# Patient Record
Sex: Female | Born: 1990 | Hispanic: Yes | Marital: Single | Smoking: Never smoker
Health system: Southern US, Community
[De-identification: ages and names within clinical notes are randomized; demographics above are authoritative.]

## PROBLEM LIST (undated history)

## (undated) DIAGNOSIS — K08409 Partial loss of teeth, unspecified cause, unspecified class: Secondary | ICD-10-CM

## (undated) DIAGNOSIS — B009 Herpesviral infection, unspecified: Secondary | ICD-10-CM

## (undated) DIAGNOSIS — Z8742 Personal history of other diseases of the female genital tract: Secondary | ICD-10-CM

## (undated) DIAGNOSIS — Z9889 Other specified postprocedural states: Secondary | ICD-10-CM

## (undated) DIAGNOSIS — D649 Anemia, unspecified: Secondary | ICD-10-CM

## (undated) DIAGNOSIS — N83209 Unspecified ovarian cyst, unspecified side: Secondary | ICD-10-CM

## (undated) HISTORY — PX: WISDOM TOOTH EXTRACTION: SHX21

## (undated) HISTORY — DX: Herpesviral infection, unspecified: B00.9

## (undated) SURGERY — Surgical Case
Anesthesia: *Unknown

---

## 2010-12-21 ENCOUNTER — Inpatient Hospital Stay (INDEPENDENT_AMBULATORY_CARE_PROVIDER_SITE_OTHER)
Admission: RE | Admit: 2010-12-21 | Discharge: 2010-12-21 | Disposition: A | Discharge status: Home / Self Care | Payer: 59 | Source: Ambulatory Visit | Attending: Family Medicine | Admitting: Family Medicine

## 2010-12-21 DIAGNOSIS — L089 Local infection of the skin and subcutaneous tissue, unspecified: Secondary | ICD-10-CM

## 2012-07-03 DIAGNOSIS — K08409 Partial loss of teeth, unspecified cause, unspecified class: Secondary | ICD-10-CM

## 2012-07-03 HISTORY — DX: Partial loss of teeth, unspecified cause, unspecified class: K08.409

## 2013-09-10 ENCOUNTER — Inpatient Hospital Stay (HOSPITAL_COMMUNITY)
Admission: AD | Admit: 2013-09-10 | Discharge: 2013-09-11 | Disposition: A | Discharge status: Home / Self Care | Payer: 59 | Source: Ambulatory Visit | Attending: Obstetrics and Gynecology | Admitting: Obstetrics and Gynecology

## 2013-09-10 ENCOUNTER — Inpatient Hospital Stay (HOSPITAL_COMMUNITY): Payer: 59

## 2013-09-10 ENCOUNTER — Encounter (HOSPITAL_COMMUNITY): Payer: Self-pay | Admitting: General Practice

## 2013-09-10 DIAGNOSIS — R1900 Intra-abdominal and pelvic swelling, mass and lump, unspecified site: Secondary | ICD-10-CM | POA: Insufficient documentation

## 2013-09-10 DIAGNOSIS — IMO0002 Reserved for concepts with insufficient information to code with codable children: Secondary | ICD-10-CM

## 2013-09-10 DIAGNOSIS — R109 Unspecified abdominal pain: Secondary | ICD-10-CM | POA: Insufficient documentation

## 2013-09-10 LAB — POCT PREGNANCY, URINE: Preg Test, Ur: NEGATIVE

## 2013-09-10 LAB — WET PREP, GENITAL
Clue Cells Wet Prep HPF POC: NONE SEEN
Trich, Wet Prep: NONE SEEN
Yeast Wet Prep HPF POC: NONE SEEN

## 2013-09-10 LAB — CBC
HEMATOCRIT: 34.1 % — AB (ref 36.0–46.0)
HEMOGLOBIN: 11.6 g/dL — AB (ref 12.0–15.0)
MCH: 27.8 pg (ref 26.0–34.0)
MCHC: 34 g/dL (ref 30.0–36.0)
MCV: 81.6 fL (ref 78.0–100.0)
Platelets: 220 10*3/uL (ref 150–400)
RBC: 4.18 MIL/uL (ref 3.87–5.11)
RDW: 12.6 % (ref 11.5–15.5)
WBC: 7.3 10*3/uL (ref 4.0–10.5)

## 2013-09-10 LAB — URINALYSIS, ROUTINE W REFLEX MICROSCOPIC
Bilirubin Urine: NEGATIVE
GLUCOSE, UA: NEGATIVE mg/dL
Ketones, ur: NEGATIVE mg/dL
Leukocytes, UA: NEGATIVE
Nitrite: NEGATIVE
PROTEIN: NEGATIVE mg/dL
SPECIFIC GRAVITY, URINE: 1.015 (ref 1.005–1.030)
Urobilinogen, UA: 0.2 mg/dL (ref 0.0–1.0)
pH: 7.5 (ref 5.0–8.0)

## 2013-09-10 LAB — URINE MICROSCOPIC-ADD ON

## 2013-09-10 NOTE — MAU Provider Note (Signed)
Chief Complaint: Abdominal Pain  First Provider Initiated Contact with Patient 09/10/13 2311     SUBJECTIVE HPI: Tammy Gutierrez is a 23 y.o. female who presents with feeling like something is protruding from her vagina today. She also reports one month history of worsening low abdominal pain and pressure. She rates her Pain 7/10 on pain scale now. No history of similar pain. She is concerned that she may have uterine prolapse. Episode of increased pressure and pain occurred this evening and was preceded by passing several small clots although it is not time for her menstrual period. LMP 08/24/2013. Has not taken anything for the pain. States pain is aggravated by walking, bending over and position changes. Improves with rest.   States she received a massage of the area one week after the symptoms started and was told "that her uterus was pushed to the back and her ovary was in the wrong place".  History reviewed. No pertinent past surgical history. History   Social History  . Marital Status: Single    Spouse Name: N/A    Number of Children: N/A  . Years of Education: N/A   Occupational History  . Not on file.   Social History Main Topics  . Smoking status: Never Smoker   . Smokeless tobacco: Never Used  . Alcohol Use: No  . Drug Use: No  . Sexual Activity: Not on file   Other Topics Concern  . Not on file   Social History Narrative  . No narrative on file   No current facility-administered medications on file prior to encounter.   No current outpatient prescriptions on file prior to encounter.   No Known Allergies  ROS: Pertinent items in HPI  OBJECTIVE Blood pressure 128/91, pulse 75, temperature 98.9 F (37.2 C), temperature source Oral, resp. rate 18, height 5\' 5"  (1.651 m), weight 72.576 kg (160 lb), last menstrual period 08/24/2013, SpO2 100.00%. GENERAL: Well-developed, well-nourished female in no acute distress. Anxious. HEENT: Normocephalic HEART: normal  rate RESP: normal effort ABDOMEN: Mildly distended, non-tender. Positive bowel sounds. Negative CVA tenderness. EXTREMITIES: Nontender, no edema NEURO: Alert and oriented SPECULUM EXAM: NEFG, physiologic discharge, scant blood noted, cervix clean, nonfriable. No uterine prolapse, cystocele or rectocele noted on pelvic exam with and without Valsalva. BIMANUAL: cervix closed; uterus normal size, no adnexal tenderness or masses. Mild cervical motion tenderness.  LAB RESULTS Results for orders placed during the hospital encounter of 09/10/13 (from the past 24 hour(s))  URINALYSIS, ROUTINE W REFLEX MICROSCOPIC     Status: Abnormal   Collection Time    09/10/13  9:11 PM      Result Value Ref Range   Color, Urine YELLOW  YELLOW   APPearance CLOUDY (*) CLEAR   Specific Gravity, Urine 1.015  1.005 - 1.030   pH 7.5  5.0 - 8.0   Glucose, UA NEGATIVE  NEGATIVE mg/dL   Hgb urine dipstick SMALL (*) NEGATIVE   Bilirubin Urine NEGATIVE  NEGATIVE   Ketones, ur NEGATIVE  NEGATIVE mg/dL   Protein, ur NEGATIVE  NEGATIVE mg/dL   Urobilinogen, UA 0.2  0.0 - 1.0 mg/dL   Nitrite NEGATIVE  NEGATIVE   Leukocytes, UA NEGATIVE  NEGATIVE  URINE MICROSCOPIC-ADD ON     Status: Abnormal   Collection Time    09/10/13  9:11 PM      Result Value Ref Range   Squamous Epithelial / LPF FEW (*) RARE   WBC, UA 0-2  <3 WBC/hpf   RBC / HPF 0-2  <  3 RBC/hpf   Bacteria, UA RARE  RARE   Urine-Other AMORPHOUS URATES/PHOSPHATES    POCT PREGNANCY, URINE     Status: None   Collection Time    09/10/13  9:24 PM      Result Value Ref Range   Preg Test, Ur NEGATIVE  NEGATIVE  WET PREP, GENITAL     Status: Abnormal   Collection Time    09/10/13 11:18 PM      Result Value Ref Range   Yeast Wet Prep HPF POC NONE SEEN  NONE SEEN   Trich, Wet Prep NONE SEEN  NONE SEEN   Clue Cells Wet Prep HPF POC NONE SEEN  NONE SEEN   WBC, Wet Prep HPF POC FEW (*) NONE SEEN  CBC     Status: Abnormal   Collection Time    09/10/13 11:32 PM       Result Value Ref Range   WBC 7.3  4.0 - 10.5 K/uL   RBC 4.18  3.87 - 5.11 MIL/uL   Hemoglobin 11.6 (*) 12.0 - 15.0 g/dL   HCT 78.2 (*) 95.6 - 21.3 %   MCV 81.6  78.0 - 100.0 fL   MCH 27.8  26.0 - 34.0 pg   MCHC 34.0  30.0 - 36.0 g/dL   RDW 08.6  57.8 - 46.9 %   Platelets 220  150 - 400 K/uL    IMAGING US Transvaginal Non-ob  09/11/2013   CLINICAL DATA Pelvic pain  EXAM TRANSABDOMINAL AND TRANSVAGINAL ULTRASOUND OF PELVIS  TECHNIQUE Both transabdominal and transvaginal ultrasound examinations of the pelvis were performed. Transabdominal technique was performed for global imaging of the pelvis including uterus, ovaries, adnexal regions, and pelvic cul-de-sac. It was necessary to proceed with endovaginal exam following the transabdominal exam to visualize the endometrium and adnexa.  COMPARISON None  FINDINGS Uterus  Measurements: 7.4 x 2.7 x 3.6 cm. No fibroids or other mass visualized.  Endometrium  Thickness: 6 mm.  No focal abnormality visualized.  Right ovary  Measurements: 4.6 x 2.8 x 2.2 cm. Normal appearance/no adnexal mass.  Left ovary  Measurements: 3.8 x 2.1 x 2.5 cm. Normal appearance/no adnexal mass.  Other findings  Anechoic cystic mass within the pelvis measuring 9.9 x 7.3 x 9.6 cm.  IMPRESSION 9.9 x 7.3 x 9.6 cm anechoic cystic mass within the pelvis. This may be paraovarian or perhaps exophytic from the right ovary. Recommend nonemergent pelvic MRI follow-up.  SIGNATURE  Electronically Signed   By: Jearld Lesch M.D.   On: 09/11/2013 01:25   US Pelvis Complete  09/11/2013   CLINICAL DATA Pelvic pain  EXAM TRANSABDOMINAL AND TRANSVAGINAL ULTRASOUND OF PELVIS  TECHNIQUE Both transabdominal and transvaginal ultrasound examinations of the pelvis were performed. Transabdominal technique was performed for global imaging of the pelvis including uterus, ovaries, adnexal regions, and pelvic cul-de-sac. It was necessary to proceed with endovaginal exam following the transabdominal exam  to visualize the endometrium and adnexa.  COMPARISON None  FINDINGS Uterus  Measurements: 7.4 x 2.7 x 3.6 cm. No fibroids or other mass visualized.  Endometrium  Thickness: 6 mm.  No focal abnormality visualized.  Right ovary  Measurements: 4.6 x 2.8 x 2.2 cm. Normal appearance/no adnexal mass.  Left ovary  Measurements: 3.8 x 2.1 x 2.5 cm. Normal appearance/no adnexal mass.  Other findings  Anechoic cystic mass within the pelvis measuring 9.9 x 7.3 x 9.6 cm.  IMPRESSION 9.9 x 7.3 x 9.6 cm anechoic cystic mass within the pelvis. This may  be paraovarian or perhaps exophytic from the right ovary. Recommend nonemergent pelvic MRI follow-up.  SIGNATURE  Electronically Signed   By: Jearld Lesch M.D.   On: 09/11/2013 01:25    MAU COURSE CBC, [place], wet prep, gonorrhea and Chlamydia cultures ordered.  Discussed patient's symptoms and ultrasound findings with Dr. Emelda Fear. Will order outpatient MRI as suggested by radiologist to hopefully clarify origin of this cyst.  ASSESSMENT 1. Pelvic cyst    PLAN Discharge home in stable condition per consult with Dr. Emelda Fear. Outpatient MRI orders. Torsion precautions (in case ovarian)     Follow-up Information   Follow up with [facility]. (will call you to schedule an appointment)    Contact information:   [address] [place] [address] [phone]       Follow up with [facility] [place] [facility]. (As needed in emergencies)    Contact information:   [address] [id number] [place] [address] [phone]      Follow up with [facility]. (will call you to schedule MRI)    Specialty:  Radiology   Contact information:   [address] [place] [id number] [place] [address] [phone]       Medication List         ibuprofen 200 MG tablet  Commonly known as:  ADVIL,MOTRIN  Take 400 mg by mouth every 8 (eight) hours as needed for headache or  moderate pain.       [place], CNM 09/11/2013  3:30 AM

## 2013-09-10 NOTE — MAU Note (Signed)
Pt reports she has been having bleeding off/on and pain in lower abd. States she was seen at ER in [place] one month ago and dx with bacterial infection. States she now feels like there is something protruding  From  Her vagina. Some spotting today.

## 2013-09-11 ENCOUNTER — Telehealth: Payer: Self-pay | Admitting: Advanced Practice Midwife

## 2013-09-11 DIAGNOSIS — N9489 Other specified conditions associated with female genital organs and menstrual cycle: Secondary | ICD-10-CM

## 2013-09-11 LAB — GC/CHLAMYDIA PROBE AMP
CT Probe RNA: NEGATIVE
GC PROBE AMP APTIMA: NEGATIVE

## 2013-09-11 NOTE — Discharge Instructions (Signed)

## 2013-09-11 NOTE — Telephone Encounter (Signed)
Patient states she is going to Dr. Tamela Oddi[place] office for follow-up appointment.

## 2013-09-12 NOTE — MAU Provider Note (Signed)
`````  Attestation of Attending Supervision of Advanced Practitioner: Evaluation and management procedures were performed by the PA/NP/CNM/OB Fellow under my supervision/collaboration. Chart reviewed and agree with management and plan.  Sharnelle Cappelli V 09/12/2013 6:39 PM    

## 2013-09-17 NOTE — Progress Notes (Signed)
[facility]s MRI of Pelvis w/without contrast needs pre-auth. Started pre-auth with [facility]-- was not confirmed immediately and stated more clinical information was needed for full review. Called [facility] at [phone] and spoke to nurse who stated to fax recent office visit/notes/imaging to [phone] with ONG#[id number]#[id number] (case number). Was informed that it can take up to 48 hours for case to be reviewed and accepted or declined. Most recent visit to MAU and imaging studies faxed to [phone] with successful fax notice received. Called [facility] to follow [facility]; informed her that it may take up to 48 hours. Lynden AngVicky stated she will continue to check and if not back by Friday morning will re-schedule pt.'s appointment. No further work needed on our end.

## 2013-09-19 ENCOUNTER — Ambulatory Visit
Admission: RE | Admit: 2013-09-19 | Discharge: 2013-09-19 | Disposition: A | Discharge status: Home / Self Care | Payer: 59 | Source: Ambulatory Visit | Attending: Advanced Practice Midwife | Admitting: Advanced Practice Midwife

## 2013-09-19 DIAGNOSIS — IMO0002 Reserved for concepts with insufficient information to code with codable children: Secondary | ICD-10-CM

## 2013-09-19 MED ORDER — GADOBENATE DIMEGLUMINE 529 MG/ML IV SOLN
15.0000 mL | Freq: Once | INTRAVENOUS | Status: AC | PRN
  Administered 2013-09-19: 15 mL via INTRAVENOUS

## 2013-10-02 ENCOUNTER — Ambulatory Visit (INDEPENDENT_AMBULATORY_CARE_PROVIDER_SITE_OTHER): Payer: 59 | Admitting: Obstetrics & Gynecology

## 2013-10-02 ENCOUNTER — Encounter: Payer: Self-pay | Admitting: Obstetrics & Gynecology

## 2013-10-02 ENCOUNTER — Ambulatory Visit: Payer: Managed Care, Other (non HMO) | Admitting: Obstetrics & Gynecology

## 2013-10-02 VITALS — BP 109/71 | HR 66 | Ht 65.0 in | Wt 165.0 lb

## 2013-10-02 DIAGNOSIS — N83209 Unspecified ovarian cyst, unspecified side: Secondary | ICD-10-CM

## 2013-10-02 DIAGNOSIS — R1900 Intra-abdominal and pelvic swelling, mass and lump, unspecified site: Secondary | ICD-10-CM

## 2013-10-02 LAB — POCT URINE PREGNANCY: Preg Test, Ur: NEGATIVE

## 2013-10-02 NOTE — Progress Notes (Signed)
Subjective:     Tammy Gutierrez is a 23 y.o. female here for a routine exam.  Current complaints: Patient is in the office today for a problem visit. Patient states [facility] and was told she had a cyst. Patient states they told her to follow up here about surgery. Patient states she took Plan B twice and would like to know if that would have caused the enlargement of the cyst. Patient states last year she was having bleeding every time she had intercourse, Patient states she hasn't had any bleeding since February. Patient states she was also having discharge but not anymore. Patient states it hurts to walk or lift things.   The HPI was reviewed and explored in further detail by the provider. Gynecologic History Patient's last menstrual period was 09/30/2013. Contraception: abstinence Last Pap: 2014. Results were: normal  Obstetric History OB History  Gravida Para Term Preterm AB SAB TAB Ectopic Multiple Living  0 0 0 0 0 0 0 0 0 0          The following portions of the patient's history were reviewed and updated as appropriate: allergies, current medications, past family history, past medical history, past social history, past surgical history and problem list.  Review of Systems Pertinent items are noted in HPI.    Objective:     Abd: NT Pelvic: cystic mass anterior/superior to bladder--NT     75% of 20 min visit spent on counseling and coordination of care.   Assessment:    Large, adnexal cyst--symptomatic--? Intermittent torsion  Plan:   A laparoscopic ovarian cystectomy is planned The risks/benefits of surgery were reviewed Return postop

## 2013-10-03 DIAGNOSIS — R1900 Intra-abdominal and pelvic swelling, mass and lump, unspecified site: Secondary | ICD-10-CM | POA: Insufficient documentation

## 2013-10-03 NOTE — Patient Instructions (Signed)
Ovarian Cystectomy Ovarian cystectomy is surgery to remove a fluid-filled sac (cyst) on an ovary. The ovaries are small organs that produce eggs in women. Various types of cysts can form on the ovaries. Most are not cancerous. Surgery may be done if a cyst is large or is causing symptoms such as pain. It may also be done for a cyst that is or might be cancerous. This surgery can be done using a laparoscopic technique or an open abdominal technique. The laparoscopic technique involves smaller cuts (incisions) and a faster recovery time. The technique used will depend on your age, the type of cyst, and whether the cyst is cancerous. The laparoscopic technique is not used for a cancerous cyst. LET YOUR HEALTH CARE PROVIDER KNOW ABOUT:   Any allergies you have.  All medicines you are taking, including vitamins, herbs, eye drops, creams, and over-the-counter medicines.  Previous problems you or members of your family have had with the use of anesthetics.  Any blood disorders you have.  Previous surgeries you have had.  Medical conditions you have.  Any chance you might be pregnant. RISKS AND COMPLICATIONS Generally, this is a safe procedure. However, as with any procedure, complications can occur. Possible complications include:  Excessive bleeding.  Infection.  Injury to other organs.  Blood clots.  Becoming incapable of getting pregnant (infertile). BEFORE THE PROCEDURE  Ask your health care provider about changing or stopping any regular medicines. Avoid taking aspirin, ibuprofen, or blood thinners as directed by your health care provider.  Do not eat or drink anything after midnight the night before surgery.  If you smoke, do not smoke for at least 2 weeks before your surgery.  Do not drink alcohol the day before your surgery.  Let your health care provider know if you develop a cold or any infection before your surgery.  Arrange for someone to drive you home after the  procedure or after your hospital stay. Also arrange for someone to help you with activities during recovery. PROCEDURE  Either a laparoscopic technique or an open abdominal technique may be used for this surgery.  Small monitors will be put on your body. They are used to check your heart, blood pressure, and oxygen level.   An IV access tube will be put into one of your veins. Medicine will be able to flow directly into your body through this IV tube.   You might be given a medicine to help you relax (sedative).   You will be given a medicine to make you sleep (general anesthetic). A breathing tube may be placed into your lungs during the procedure. Laparoscopic Technique  Several small cuts (incisions) are made in your abdomen. These are typically about 1 to 2 cm long.   Your abdomen will be filled with carbon dioxide gas so that it expands. This gives the surgeon more room to operate and makes your organs easier to see.   A thin, lighted tube with a tiny camera on the end (laparoscope) is put through one of the small incisions. The camera on the laparoscope sends a picture to a TV screen in the operating room. This gives the surgeon a good view inside your abdomen.   Hollow tubes are put through the other small incisions in your abdomen. The tools needed for the procedure are put through these tubes.  The ovary with the cyst is identified, and the cyst is removed. It is sent to the lab for testing. If it is cancer, both ovaries   may need to be removed during a different surgery.  Tools are removed. The incisions are then closed with stitches or skin glue, and dressings may be applied. Open Abdominal Technique  A single large incision is made along your bikini line or in the middle of your lower abdomen.  The ovary with the cyst is identified, and the cyst is removed. It is sent to the lab for testing. If it is cancer, both ovaries may need to be removed during a different  surgery.  The incision is then closed with stitches or staples. AFTER THE PROCEDURE   You will wake up from anesthesia and be taken to a recovery area.  If you had laparoscopic surgery, you may be able to go home the same day, or you may need to stay in the hospital overnight.  If you had open abdominal surgery, you will need to stay in the hospital for a few days.  Your IV access tube and catheter will be removed the first or second day, after you are able to eat and drink enough.  You may be given medicine to relieve pain or to help you sleep.  You may be given an antibiotic medicine if needed. Document Released: 04/16/2007 Document Revised: 04/09/2013 Document Reviewed: 01/29/2013 [facility] Patient Information 2014 [place], [place]. Ovarian Cyst An ovarian cyst is a fluid-filled sac that forms on an ovary. The ovaries are small organs that produce eggs in women. Various types of cysts can form on the ovaries. Most are not cancerous. Many do not cause problems, and they often go away on their own. Some may cause symptoms and require treatment. Common types of ovarian cysts include:  Functional cysts These cysts may occur every month during the menstrual cycle. This is normal. The cysts usually go away with the next menstrual cycle if the woman does not get pregnant. Usually, there are no symptoms with a functional cyst.  Endometrioma cysts These cysts form from the tissue that lines the uterus. They are also called "chocolate cysts" because they become filled with blood that turns brown. This type of cyst can cause pain in the lower abdomen during intercourse and with your menstrual period.  Cystadenoma cysts This type develops from the cells on the outside of the ovary. These cysts can get very big and cause lower abdomen pain and pain with intercourse. This type of cyst can twist on itself, cut off its blood supply, and cause severe pain. It can also easily rupture and cause a lot of  pain.  Dermoid cysts This type of cyst is sometimes found in both ovaries. These cysts may contain different kinds of body tissue, such as skin, teeth, hair, or cartilage. They usually do not cause symptoms unless they get very big.  Theca lutein cysts These cysts occur when too much of a certain hormone (human chorionic gonadotropin) is produced and overstimulates the ovaries to produce an egg. This is most common after procedures used to assist with the conception of a baby (in vitro fertilization). CAUSES   Fertility drugs can cause a condition in which multiple large cysts are formed on the ovaries. This is called ovarian hyperstimulation syndrome.  A condition called polycystic ovary syndrome can cause hormonal imbalances that can lead to nonfunctional ovarian cysts. SIGNS AND SYMPTOMS  Many ovarian cysts do not cause symptoms. If symptoms are present, they may include:  Pelvic pain or pressure.  Pain in the lower abdomen.  Pain during sexual intercourse.  Increasing girth (swelling) of  the abdomen.  Abnormal menstrual periods.  Increasing pain with menstrual periods.  Stopping having menstrual periods without being pregnant. DIAGNOSIS  These cysts are commonly found during a routine or annual pelvic exam. Tests may be ordered to find out more about the cyst. These tests may include:  Ultrasound.  X-ray of the pelvis.  CT scan.  MRI.  Blood tests. TREATMENT  Many ovarian cysts go away on their own without treatment. Your health care provider may want to check your cyst regularly for 2 3 months to see if it changes. For women in menopause, it is particularly important to monitor a cyst closely because of the higher rate of ovarian cancer in menopausal women. When treatment is needed, it may include any of the following:  A procedure to drain the cyst (aspiration). This may be done using a long needle and ultrasound. It can also be done through a laparoscopic procedure.  This involves using a thin, lighted tube with a tiny camera on the end (laparoscope) inserted through a small incision.  Surgery to remove the whole cyst. This may be done using laparoscopic surgery or an open surgery involving a larger incision in the lower abdomen.  Hormone treatment or birth control pills. These methods are sometimes used to help dissolve a cyst. HOME CARE INSTRUCTIONS   Only take over-the-counter or prescription medicines as directed by your health care provider.  Follow up with your health care provider as directed.  Get regular pelvic exams and Pap tests. SEEK MEDICAL CARE IF:   Your periods are late, irregular, or painful, or they stop.  Your pelvic pain or abdominal pain does not go away.  Your abdomen becomes larger or swollen.  You have pressure on your bladder or trouble emptying your bladder completely.  You have pain during sexual intercourse.  You have feelings of fullness, pressure, or discomfort in your stomach.  You lose weight for no apparent reason.  You feel generally ill.  You become constipated.  You lose your appetite.  You develop acne.  You have an increase in body and facial hair.  You are gaining weight, without changing your exercise and eating habits.  You think you are pregnant. SEEK IMMEDIATE MEDICAL CARE IF:   You have increasing abdominal pain.  You feel sick to your stomach (nauseous), and you throw up (vomit).  You develop a fever that comes on suddenly.  You have abdominal pain during a bowel movement.  Your menstrual periods become heavier than usual. Document Released: 06/19/2005 Document Revised: 04/09/2013 Document Reviewed: 02/24/2013 [facility] Patient Information 2014 [place], [place].

## 2013-10-06 ENCOUNTER — Other Ambulatory Visit: Payer: Self-pay | Admitting: *Deleted

## 2013-10-08 ENCOUNTER — Encounter: Payer: Self-pay | Admitting: Obstetrics & Gynecology

## 2013-10-08 ENCOUNTER — Other Ambulatory Visit: Payer: Self-pay | Admitting: *Deleted

## 2013-10-09 ENCOUNTER — Encounter (HOSPITAL_COMMUNITY): Payer: Self-pay | Admitting: Pharmacist

## 2013-10-13 ENCOUNTER — Encounter (HOSPITAL_COMMUNITY)
Admission: RE | Admit: 2013-10-13 | Discharge: 2013-10-13 | Disposition: A | Discharge status: Home / Self Care | Payer: 59 | Source: Ambulatory Visit | Attending: Obstetrics & Gynecology | Admitting: Obstetrics & Gynecology

## 2013-10-13 ENCOUNTER — Encounter (HOSPITAL_COMMUNITY): Payer: Self-pay

## 2013-10-13 ENCOUNTER — Encounter: Payer: Self-pay | Admitting: Obstetrics & Gynecology

## 2013-10-13 ENCOUNTER — Ambulatory Visit: Payer: 59 | Admitting: Obstetrics & Gynecology

## 2013-10-13 VITALS — BP 116/74 | HR 68 | Temp 97.8°F | Ht 65.0 in | Wt 164.0 lb

## 2013-10-13 DIAGNOSIS — R109 Unspecified abdominal pain: Secondary | ICD-10-CM

## 2013-10-13 DIAGNOSIS — R1900 Intra-abdominal and pelvic swelling, mass and lump, unspecified site: Secondary | ICD-10-CM

## 2013-10-13 DIAGNOSIS — Z01812 Encounter for preprocedural laboratory examination: Secondary | ICD-10-CM | POA: Insufficient documentation

## 2013-10-13 LAB — CBC
HEMATOCRIT: 34.2 % — AB (ref 36.0–46.0)
HEMOGLOBIN: 11.5 g/dL — AB (ref 12.0–15.0)
MCH: 28 pg (ref 26.0–34.0)
MCHC: 33.6 g/dL (ref 30.0–36.0)
MCV: 83.4 fL (ref 78.0–100.0)
PLATELETS: 256 10*3/uL (ref 150–400)
RBC: 4.1 MIL/uL (ref 3.87–5.11)
RDW: 12.7 % (ref 11.5–15.5)
WBC: 6.8 10*3/uL (ref 4.0–10.5)

## 2013-10-13 LAB — POCT URINALYSIS DIPSTICK
BILIRUBIN UA: NEGATIVE
GLUCOSE UA: NEGATIVE
KETONES UA: NEGATIVE
Leukocytes, UA: NEGATIVE
Nitrite, UA: NEGATIVE
PH UA: 5
Protein, UA: NEGATIVE
RBC UA: NEGATIVE
SPEC GRAV UA: 1.02
Urobilinogen, UA: NEGATIVE

## 2013-10-13 MED ORDER — TRAMADOL HCL 50 MG PO TABS: 50.0000 mg | ORAL_TABLET | Freq: Four times a day (QID) | ORAL | Status: DC | PRN

## 2013-10-13 NOTE — Patient Instructions (Addendum)
   Your procedure is scheduled on:  Friday, April 17  [facility] at[facility]  0845 AM Pick up the phone at the desk and dial [phone] and inform us of your arrival.  Please call this number if you have any problems the morning of surgery: [phone]  Remember: Do not eat or drink after midnight: Thursday Take these medicines the morning of surgery with a SIP OF WATER:  None  Do not wear jewelry, make-up, or FINGER nail polish No metal in your hair or on your body. Do not wear lotions, powders, perfumes.  You may wear deodorant.  Do not bring valuables to the hospital. Contacts, dentures or bridgework may not be worn into surgery.  Patients discharged on the day of surgery will not be allowed to drive home.  Home with boyfriend Lavera Guiseliel Tapia cell [phone].

## 2013-10-13 NOTE — Progress Notes (Signed)
  Subjective:     Tammy Gutierrez is a 23 y.o. female who presents for evaluation of abdominal pain. Onset was 5 days ago. Symptoms have been gradually worsening. The pain is described as cramping and worse with eating, and is 9/10 in intensity. Pain is located in the RLQ without radiation.  Aggravating factors: eating.  Alleviating factors: liquids. Associated symptoms: none. The patient denies constipation, diarrhea, flatus, nausea and vomiting. Known diagnosis of a 10 cm adnexal cyst--for surgery this week.  The patient's history has been marked as reviewed and updated as appropriate. The patient&#39;s history has been marked as reviewed and updated as appropriate.  Review of Systems Pertinent items are noted in HPI.    Objective:      General:   alert  Skin:   no rash or abnormalities  Lungs:   clear to auscultation bilaterally  Heart:   regular rate and rhythm, S1, S2 normal, no murmur, click, rub or gallop  Breasts:   normal without suspicious masses, skin or nipple changes or axillary nodes  Abdomen:  normal findings: no organomegaly, soft, non-tender and no hernia  Pelvis:  External genitalia: normal general appearance Urinary system: urethral meatus normal and bladder without fullness, nontender Vaginal: normal without tenderness, induration or masses Cervix: normal appearance Adnexa: normal bimanual exam Uterus: anteverted and non-tender, normal size    Assessment:    Abdominal pain,secondary to a large adnexal cyst .   No signs/symptoms of an acute abdomen Plan:   Check CBC Warning signs reviewed Surgery this week

## 2013-10-14 ENCOUNTER — Encounter: Payer: Self-pay | Admitting: *Deleted

## 2013-10-14 LAB — CBC WITH DIFFERENTIAL/PLATELET
BASOS PCT: 0 % (ref 0–1)
Basophils Absolute: 0 10*3/uL (ref 0.0–0.1)
EOS ABS: 0.1 10*3/uL (ref 0.0–0.7)
Eosinophils Relative: 1 % (ref 0–5)
HEMATOCRIT: 35.7 % — AB (ref 36.0–46.0)
HEMOGLOBIN: 11.7 g/dL — AB (ref 12.0–15.0)
Lymphocytes Relative: 36 % (ref 12–46)
Lymphs Abs: 2.7 10*3/uL (ref 0.7–4.0)
MCH: 27.1 pg (ref 26.0–34.0)
MCHC: 32.8 g/dL (ref 30.0–36.0)
MCV: 82.8 fL (ref 78.0–100.0)
MONO ABS: 0.5 10*3/uL (ref 0.1–1.0)
MONOS PCT: 6 % (ref 3–12)
Neutro Abs: 4.3 10*3/uL (ref 1.7–7.7)
Neutrophils Relative %: 57 % (ref 43–77)
Platelets: 295 10*3/uL (ref 150–400)
RBC: 4.31 MIL/uL (ref 3.87–5.11)
RDW: 13.7 % (ref 11.5–15.5)
WBC: 7.5 10*3/uL (ref 4.0–10.5)

## 2013-10-16 NOTE — H&P (Signed)
  Chief Complaint: 23 y.o.  who presents with a pelvic mass  Details of Present Illness: The patient presented with pelvic pain that has been gradually worsening.  Radiologic imaging shows a 10 cm, simple, cystic pelvic mass--possibly paraovarian.  Ht 5\' 5"  (1.651 m)  Wt 72.576 kg (160 lb)  BMI 26.63 kg/m2  No past medical history on file. History   Social History  . Marital Status: Single    Spouse Name: N/A    Number of Children: N/A  . Years of Education: N/A   Occupational History  . Not on file.   Social History Main Topics  . Smoking status: Never Smoker   . Smokeless tobacco: Never Used  . Alcohol Use: No  . Drug Use: No  . Sexual Activity: Not Currently    Birth Control/ Protection: None   Other Topics Concern  . Not on file   Social History Narrative  . No narrative on file   Family History  Problem Relation Age of Onset  . Depression Mother   . Cancer Neg Hx   . Heart disease Neg Hx   . Diabetes Neg Hx   . Hypertension Neg Hx     Pertinent items are noted in HPI.  Pre-Op Diagnosis: Pelvic mass [id number]   Planned Procedure: Procedure(s): ROBOTIC ASSISTED LAPAROSCOPIC OVARIAN CYSTECTOMY  I have reviewed the patient's history and have completed the physical exam and Cambryn Luis-Arriaga is acceptable for surgery.  Antionette CharLisa Jackson-Moore, MD 10/16/2013 7:01 PM

## 2013-10-17 ENCOUNTER — Ambulatory Visit (HOSPITAL_COMMUNITY): Payer: 59 | Admitting: Anesthesiology

## 2013-10-17 ENCOUNTER — Encounter (HOSPITAL_COMMUNITY): Payer: 59 | Admitting: Anesthesiology

## 2013-10-17 ENCOUNTER — Other Ambulatory Visit: Payer: Self-pay | Admitting: Obstetrics

## 2013-10-17 ENCOUNTER — Encounter (HOSPITAL_COMMUNITY): Payer: Self-pay | Admitting: *Deleted

## 2013-10-17 ENCOUNTER — Encounter (HOSPITAL_COMMUNITY)
Admission: RE | Disposition: A | Discharge status: Home / Self Care | Payer: Self-pay | Source: Ambulatory Visit | Attending: Obstetrics & Gynecology

## 2013-10-17 ENCOUNTER — Ambulatory Visit (HOSPITAL_COMMUNITY)
Admission: RE | Admit: 2013-10-17 | Discharge: 2013-10-17 | Disposition: A | Discharge status: Home / Self Care | Payer: 59 | Source: Ambulatory Visit | Attending: Obstetrics & Gynecology | Admitting: Obstetrics & Gynecology

## 2013-10-17 DIAGNOSIS — N949 Unspecified condition associated with female genital organs and menstrual cycle: Secondary | ICD-10-CM | POA: Insufficient documentation

## 2013-10-17 DIAGNOSIS — Z8742 Personal history of other diseases of the female genital tract: Secondary | ICD-10-CM

## 2013-10-17 DIAGNOSIS — R1900 Intra-abdominal and pelvic swelling, mass and lump, unspecified site: Secondary | ICD-10-CM

## 2013-10-17 DIAGNOSIS — N838 Other noninflammatory disorders of ovary, fallopian tube and broad ligament: Secondary | ICD-10-CM | POA: Insufficient documentation

## 2013-10-17 DIAGNOSIS — R52 Pain, unspecified: Secondary | ICD-10-CM

## 2013-10-17 HISTORY — DX: Personal history of other diseases of the female genital tract: Z87.42

## 2013-10-17 HISTORY — PX: ROBOTIC ASSISTED LAPAROSCOPIC OVARIAN CYSTECTOMY: SHX6081

## 2013-10-17 LAB — PREGNANCY, URINE: Preg Test, Ur: NEGATIVE

## 2013-10-17 SURGERY — ROBOTIC ASSISTED LAPAROSCOPIC OVARIAN CYSTECTOMY
Anesthesia: General | Site: Abdomen

## 2013-10-17 SURGERY — Surgical Case
Anesthesia: *Unknown

## 2013-10-17 MED ORDER — MIDAZOLAM HCL 2 MG/2ML IJ SOLN
INTRAMUSCULAR | Status: AC
  Filled 2013-10-17: qty 2

## 2013-10-17 MED ORDER — KETOROLAC TROMETHAMINE 30 MG/ML IJ SOLN: 15.0000 mg | Freq: Once | INTRAMUSCULAR | Status: DC | PRN

## 2013-10-17 MED ORDER — OXYCODONE HCL 5 MG PO TABS: 5.0000 mg | ORAL_TABLET | ORAL | Status: DC | PRN

## 2013-10-17 MED ORDER — ARTIFICIAL TEARS OP OINT
TOPICAL_OINTMENT | OPHTHALMIC | Status: DC | PRN
  Administered 2013-10-17: 1 via OPHTHALMIC

## 2013-10-17 MED ORDER — LACTATED RINGERS IV SOLN
INTRAVENOUS | Status: DC
  Administered 2013-10-17 (×2): via INTRAVENOUS

## 2013-10-17 MED ORDER — PROPOFOL 10 MG/ML IV BOLUS
INTRAVENOUS | Status: DC | PRN
  Administered 2013-10-17: 150 mg via INTRAVENOUS

## 2013-10-17 MED ORDER — BUPIVACAINE HCL (PF) 0.25 % IJ SOLN
INTRAMUSCULAR | Status: DC | PRN
  Administered 2013-10-17: 9 mL

## 2013-10-17 MED ORDER — NEOSTIGMINE METHYLSULFATE 1 MG/ML IJ SOLN
INTRAMUSCULAR | Status: DC | PRN
  Administered 2013-10-17: 3 mg via INTRAVENOUS

## 2013-10-17 MED ORDER — KETOROLAC TROMETHAMINE 30 MG/ML IJ SOLN
30.0000 mg | Freq: Four times a day (QID) | INTRAMUSCULAR | Status: DC
Start: 2013-10-17 — End: 2013-10-17
  Administered 2013-10-17: 30 mg via INTRAVENOUS

## 2013-10-17 MED ORDER — LIDOCAINE HCL (CARDIAC) 20 MG/ML IV SOLN
INTRAVENOUS | Status: DC | PRN
  Administered 2013-10-17: 80 mg via INTRAVENOUS

## 2013-10-17 MED ORDER — MEPERIDINE HCL 25 MG/ML IJ SOLN: 6.2500 mg | INTRAMUSCULAR | Status: DC | PRN

## 2013-10-17 MED ORDER — KETOROLAC TROMETHAMINE 30 MG/ML IJ SOLN
INTRAMUSCULAR | Status: AC
  Administered 2013-10-17: 30 mg via INTRAVENOUS
  Filled 2013-10-17: qty 1

## 2013-10-17 MED ORDER — FENTANYL CITRATE 0.05 MG/ML IJ SOLN
INTRAMUSCULAR | Status: AC
  Filled 2013-10-17: qty 5

## 2013-10-17 MED ORDER — DEXAMETHASONE SODIUM PHOSPHATE 10 MG/ML IJ SOLN
INTRAMUSCULAR | Status: AC
  Filled 2013-10-17: qty 1

## 2013-10-17 MED ORDER — TRAMADOL HCL 50 MG PO TABS: 50.0000 mg | ORAL_TABLET | Freq: Four times a day (QID) | ORAL | Status: DC | PRN

## 2013-10-17 MED ORDER — HEPARIN SODIUM (PORCINE) 5000 UNIT/ML IJ SOLN
INTRAMUSCULAR | Status: AC
  Filled 2013-10-17: qty 1

## 2013-10-17 MED ORDER — PROPOFOL 10 MG/ML IV EMUL
INTRAVENOUS | Status: AC
  Filled 2013-10-17: qty 20

## 2013-10-17 MED ORDER — SODIUM CHLORIDE 0.9 % IV SOLN: 250.0000 mL | INTRAVENOUS | Status: DC | PRN

## 2013-10-17 MED ORDER — ROCURONIUM BROMIDE 100 MG/10ML IV SOLN
INTRAVENOUS | Status: DC | PRN
  Administered 2013-10-17: 5 mg via INTRAVENOUS
  Administered 2013-10-17: 25 mg via INTRAVENOUS
  Administered 2013-10-17: 10 mg via INTRAVENOUS

## 2013-10-17 MED ORDER — BUPIVACAINE HCL (PF) 0.25 % IJ SOLN
INTRAMUSCULAR | Status: AC
  Filled 2013-10-17: qty 30

## 2013-10-17 MED ORDER — ONDANSETRON HCL 4 MG/2ML IJ SOLN
INTRAMUSCULAR | Status: AC
  Filled 2013-10-17: qty 2

## 2013-10-17 MED ORDER — MIDAZOLAM HCL 2 MG/2ML IJ SOLN
INTRAMUSCULAR | Status: DC | PRN
  Administered 2013-10-17: 2 mg via INTRAVENOUS

## 2013-10-17 MED ORDER — IBUPROFEN 800 MG PO TABS: 800.0000 mg | ORAL_TABLET | Freq: Three times a day (TID) | ORAL | Status: DC | PRN

## 2013-10-17 MED ORDER — LIDOCAINE HCL (CARDIAC) 20 MG/ML IV SOLN
INTRAVENOUS | Status: AC
  Filled 2013-10-17: qty 5

## 2013-10-17 MED ORDER — GLYCOPYRROLATE 0.2 MG/ML IJ SOLN
INTRAMUSCULAR | Status: AC
  Filled 2013-10-17: qty 3

## 2013-10-17 MED ORDER — SODIUM CHLORIDE 0.9 % IJ SOLN: 3.0000 mL | Freq: Two times a day (BID) | INTRAMUSCULAR | Status: DC

## 2013-10-17 MED ORDER — ACETAMINOPHEN 325 MG PO TABS: 650.0000 mg | ORAL_TABLET | ORAL | Status: DC | PRN

## 2013-10-17 MED ORDER — FENTANYL CITRATE 0.05 MG/ML IJ SOLN
INTRAMUSCULAR | Status: DC | PRN
  Administered 2013-10-17: 50 ug via INTRAVENOUS
  Administered 2013-10-17: 100 ug via INTRAVENOUS
  Administered 2013-10-17 (×2): 50 ug via INTRAVENOUS

## 2013-10-17 MED ORDER — HYDROMORPHONE HCL PF 1 MG/ML IJ SOLN
INTRAMUSCULAR | Status: AC
  Administered 2013-10-17: 0.25 mg via INTRAVENOUS
  Filled 2013-10-17: qty 1

## 2013-10-17 MED ORDER — SODIUM CHLORIDE 0.9 % IJ SOLN: 3.0000 mL | INTRAMUSCULAR | Status: DC | PRN

## 2013-10-17 MED ORDER — HYDROMORPHONE HCL PF 1 MG/ML IJ SOLN
0.2500 mg | INTRAMUSCULAR | Status: DC | PRN
  Administered 2013-10-17: 0.25 mg via INTRAVENOUS

## 2013-10-17 MED ORDER — ONDANSETRON HCL 4 MG/2ML IJ SOLN
INTRAMUSCULAR | Status: DC | PRN
  Administered 2013-10-17: 4 mg via INTRAVENOUS

## 2013-10-17 MED ORDER — GLYCOPYRROLATE 0.2 MG/ML IJ SOLN
INTRAMUSCULAR | Status: DC | PRN
  Administered 2013-10-17: 0.1 mg via INTRAVENOUS
  Administered 2013-10-17: .5 mg via INTRAVENOUS

## 2013-10-17 MED ORDER — ACETAMINOPHEN 650 MG RE SUPP
650.0000 mg | RECTAL | Status: DC | PRN
  Filled 2013-10-17: qty 1

## 2013-10-17 MED ORDER — FENTANYL CITRATE 0.05 MG/ML IJ SOLN: 25.0000 ug | INTRAMUSCULAR | Status: DC | PRN

## 2013-10-17 MED ORDER — NEOSTIGMINE METHYLSULFATE 1 MG/ML IJ SOLN
INTRAMUSCULAR | Status: AC
  Filled 2013-10-17: qty 1

## 2013-10-17 MED ORDER — PROMETHAZINE HCL 25 MG/ML IJ SOLN: 6.2500 mg | INTRAMUSCULAR | Status: DC | PRN

## 2013-10-17 MED ORDER — MIDAZOLAM HCL 2 MG/2ML IJ SOLN: 0.5000 mg | Freq: Once | INTRAMUSCULAR | Status: DC | PRN

## 2013-10-17 MED ORDER — DEXAMETHASONE SODIUM PHOSPHATE 10 MG/ML IJ SOLN
INTRAMUSCULAR | Status: DC | PRN
  Administered 2013-10-17: 10 mg via INTRAVENOUS

## 2013-10-17 SURGICAL SUPPLY — 62 items
BARRIER ADHS 3X4 INTERCEED (GAUZE/BANDAGES/DRESSINGS) IMPLANT
BENZOIN TINCTURE PRP APPL 2/3 (GAUZE/BANDAGES/DRESSINGS) ×3 IMPLANT
CABLE HIGH FREQUENCY MONO STRZ (ELECTRODE) ×3 IMPLANT
CLOSURE WOUND 1/4X4 (GAUZE/BANDAGES/DRESSINGS) ×1
CLOTH BEACON ORANGE TIMEOUT ST (SAFETY) ×3 IMPLANT
CONT PATH 16OZ SNAP LID 3702 (MISCELLANEOUS) ×3 IMPLANT
CORD BIPOLAR FORCEPS 12FT (ELECTRODE) ×3 IMPLANT
COVER MAYO STAND STRL (DRAPES) ×3 IMPLANT
COVER TABLE BACK 60X90 (DRAPES) ×6 IMPLANT
COVER TIP SHEARS 8 DVNC (MISCELLANEOUS) ×1 IMPLANT
COVER TIP SHEARS 8MM DA VINCI (MISCELLANEOUS) ×2
DECANTER SPIKE VIAL GLASS SM (MISCELLANEOUS) ×3 IMPLANT
DERMABOND ADVANCED (GAUZE/BANDAGES/DRESSINGS) ×2
DERMABOND ADVANCED .7 DNX12 (GAUZE/BANDAGES/DRESSINGS) ×1 IMPLANT
DRAPE HUG U DISPOSABLE (DRAPE) ×3 IMPLANT
DRAPE LG THREE QUARTER DISP (DRAPES) ×6 IMPLANT
DRAPE WARM FLUID 44X44 (DRAPE) ×3 IMPLANT
ELECT REM PT RETURN 9FT ADLT (ELECTROSURGICAL) ×3
ELECTRODE REM PT RTRN 9FT ADLT (ELECTROSURGICAL) ×1 IMPLANT
EVACUATOR SMOKE 8.L (FILTER) ×3 IMPLANT
GAUZE VASELINE 3X9 (GAUZE/BANDAGES/DRESSINGS) IMPLANT
GLOVE BIO SURGEON STRL SZ 6.5 (GLOVE) ×4 IMPLANT
GLOVE BIO SURGEON STRL SZ8 (GLOVE) ×6 IMPLANT
GLOVE BIO SURGEONS STRL SZ 6.5 (GLOVE) ×2
GLOVE BIOGEL PI IND STRL 7.0 (GLOVE) ×2 IMPLANT
GLOVE BIOGEL PI INDICATOR 7.0 (GLOVE) ×4
GLOVE ECLIPSE 6.5 STRL STRAW (GLOVE) ×12 IMPLANT
GOWN STRL REUS W/TWL LRG LVL3 (GOWN DISPOSABLE) ×18 IMPLANT
LEGGING LITHOTOMY PAIR STRL (DRAPES) ×3 IMPLANT
MANIPULATOR UTERINE 4.5 ZUMI (MISCELLANEOUS) ×3 IMPLANT
NEEDLE INSUFFLATION 120MM (ENDOMECHANICALS) ×3 IMPLANT
OCCLUDER COLPOPNEUMO (BALLOONS) IMPLANT
PACK LAVH (CUSTOM PROCEDURE TRAY) ×3 IMPLANT
PAD PREP 24X48 CUFFED NSTRL (MISCELLANEOUS) ×6 IMPLANT
PROTECTOR NERVE ULNAR (MISCELLANEOUS) ×6 IMPLANT
SCRUB PCMX 4 OZ (MISCELLANEOUS) ×3 IMPLANT
SET CYSTO W/LG BORE CLAMP LF (SET/KITS/TRAYS/PACK) IMPLANT
SET IRRIG TUBING LAPAROSCOPIC (IRRIGATION / IRRIGATOR) ×3 IMPLANT
SOLUTION ELECTROLUBE (MISCELLANEOUS) ×3 IMPLANT
STRIP CLOSURE SKIN 1/4X4 (GAUZE/BANDAGES/DRESSINGS) ×2 IMPLANT
SUT VIC AB 0 CT1 27 (SUTURE)
SUT VIC AB 0 CT1 27XBRD ANTBC (SUTURE) IMPLANT
SUT VIC AB 3-0 PS2 18 (SUTURE) ×6 IMPLANT
SUT VICRYL 0 UR6 27IN ABS (SUTURE) ×3 IMPLANT
SYR 50ML LL SCALE MARK (SYRINGE) ×3 IMPLANT
SYSTEM CONVERTIBLE TROCAR (TROCAR) IMPLANT
TIP RUMI ORANGE 6.7MMX12CM (TIP) IMPLANT
TIP UTERINE 5.1X6CM LAV DISP (MISCELLANEOUS) IMPLANT
TIP UTERINE 6.7X10CM GRN DISP (MISCELLANEOUS) IMPLANT
TIP UTERINE 6.7X6CM WHT DISP (MISCELLANEOUS) IMPLANT
TIP UTERINE 6.7X8CM BLUE DISP (MISCELLANEOUS) IMPLANT
TOWEL OR 17X24 6PK STRL BLUE (TOWEL DISPOSABLE) ×9 IMPLANT
TRAY FOLEY CATH 14FR (SET/KITS/TRAYS/PACK) ×3 IMPLANT
TROCAR 12M 150ML BLUNT (TROCAR) ×3 IMPLANT
TROCAR DILATING TIP 12MM 150MM (ENDOMECHANICALS) ×3 IMPLANT
TROCAR DISP BLADELESS 8 DVNC (TROCAR) IMPLANT
TROCAR DISP BLADELESS 8MM (TROCAR)
TROCAR XCEL 12X100 BLDLESS (ENDOMECHANICALS) ×3 IMPLANT
TROCAR XCEL NON-BLD 5MMX100MML (ENDOMECHANICALS) ×3 IMPLANT
TUBING FILTER THERMOFLATOR (ELECTROSURGICAL) ×3 IMPLANT
WARMER LAPAROSCOPE (MISCELLANEOUS) ×3 IMPLANT
WATER STERILE IRR 1000ML POUR (IV SOLUTION) ×9 IMPLANT

## 2013-10-17 NOTE — Transfer of Care (Signed)
Immediate Anesthesia Transfer of Care Note  Patient: Tammy Gutierrez  Procedure(s) Performed: Procedure(s): ROBOTIC ASSISTED LAPAROSCOPIC PARATUBAL CYSTECTOMY (N/A)  Patient Location: PACU  Anesthesia Type:General  Level of Consciousness: awake, alert  and oriented  Airway & Oxygen Therapy: Patient Spontanous Breathing and Patient connected to nasal cannula oxygen  Post-op Assessment: Report given to PACU RN and Post -op Vital signs reviewed and stable  Post vital signs: Reviewed and stable  Complications: No apparent anesthesia complications

## 2013-10-17 NOTE — Anesthesia Postprocedure Evaluation (Signed)
  Anesthesia Post Note  Patient: Tammy Gutierrez  Procedure(s) Performed: Procedure(s) (LRB): ROBOTIC ASSISTED LAPAROSCOPIC PARATUBAL CYSTECTOMY (N/A)  Anesthesia type: GA  Patient location: PACU  Post pain: Pain level controlled  Post assessment: Post-op Vital signs reviewed  Last Vitals:  Filed Vitals:   10/17/13 1245  BP: 115/66  Pulse: 70  Temp:   Resp: 16    Post vital signs: Reviewed  Level of consciousness: sedated  Complications: No apparent anesthesia complications

## 2013-10-17 NOTE — Interval H&P Note (Signed)
History and Physical Interval Note:  10/17/2013 9:31 AM  Tammy Gutierrez  has presented today for surgery, with the diagnosis of Pelvic mass [phone]  The various methods of treatment have been discussed with the patient and family. After consideration of risks, benefits and other options for treatment, the patient has consented to  Procedure(s): ROBOTIC ASSISTED LAPAROSCOPIC OVARIAN CYSTECTOMY (N/A) as a surgical intervention .  The patient's history has been reviewed, patient examined, no change in status, stable for surgery.  I have reviewed the patient's chart and labs.  Questions were answered to the patient's satisfaction.     Antionette CharLisa Jackson-Moore

## 2013-10-17 NOTE — OR Nursing (Signed)
Correction to the pathology and cytology specimens for the right side paratubal cyst and right side paratubal cyst fluids.

## 2013-10-17 NOTE — Anesthesia Procedure Notes (Signed)
Procedure Name: Intubation Date/Time: 10/17/2013 10:27 AM Performed by: Graciela HusbandsFUSSELL, Griffin Dewilde O Pre-anesthesia Checklist: Patient identified, Emergency Drugs available, Suction available and Patient being monitored Patient Re-evaluated:Patient Re-evaluated prior to inductionOxygen Delivery Method: Circle system utilized Preoxygenation: Pre-oxygenation with 100% oxygen Intubation Type: IV induction Ventilation: Mask ventilation without difficulty Laryngoscope Size: 3 and Mac Grade View: Grade I Tube size: 7.0 mm Number of attempts: 1 Airway Equipment and Method: Video-laryngoscopy Placement Confirmation: ETT inserted through vocal cords under direct vision,  positive ETCO2 and breath sounds checked- equal and bilateral Secured at: 21 (teeth) cm Tube secured with: Tape Dental Injury: Teeth and Oropharynx as per pre-operative assessment

## 2013-10-17 NOTE — Op Note (Signed)
Pre-operative Diagnosis: Pelvic mass  Post-operative Diagnosis: Right paratubal cyst  Operation: Robotic-assisted removal of a paratubal cyst  Surgeon: Antionette CharLisa Jackson-Moore  Assistant: Coral Ceoharles Harper, MD  Anesthesia: GET  Urine Output: Per Anesthesiology  Findings: 10 cm right-sided paratubal cyst  Estimated Blood Loss:  Minimal                 Total IV Fluids: per Anesthesiology         Specimens: PATHOLOGY               Complications:  None; patient tolerated the procedure well.         Disposition: PACU - hemodynamically stable.         Condition: Stable    Procedure Details  The patient was seen in the Holding Room. The risks, benefits, complications, treatment options, and expected outcomes were discussed with the patient.  The patient concurred with the proposed plan, giving informed consent.  The site of surgery properly noted/marked. The patient was identified as Tammy Gutierrez and the procedure verified as a Robotic-assisted removal of a pelvic mass. A Time Out was held and the above information confirmed.  After induction of anesthesia, the patient was draped and prepped in the usual sterile manner. Pt was placed in supine position after anesthesia and draped and prepped in the usual sterile manner. The abdominal drape was placed after the CholoraPrep had been allowed to dry for 3 minutes.  Her arms were tucked to her side with all appropriate precautions.    The patient was placed in the semi-lithotomy position in [place] stirrups.  The perineum was prepped with Hibiclens.  A  foley catheter was placed.  A sterile speculum was placed in the vagina.  The cervix was grasped with a single-tooth tenaculum and dilated with Shawnie PonsPratt dilators.  The ZUMI uterine manipulator  was placed without difficulty.  OG tube placement was confirmed and to suction.  Approximately 2 cm below the costal margin, in the midclavicular line the skin was anesthestized with 0.25% Marcaine.  A 5 mm  incision was made and using a 5 mm Optiview, a 5 mm trocar was placed under direct vision.  The patient's abdomen was insufflated with CO2 gas.  At this point and all points during the procedure, the patient's intra-abdominal pressure did not exceed 15 mmHg.  A 10-12 camera port was place 24 cm above the pubic symphysis.  Bilateral 8 mm ports were place 10 cm and 15 degrees inferior.  All ports were placed under direct visualization.  The 5 mm port was removed.  The incision was extended to accommodate a 10 mm trocar.  A 10 mm trocar and sleeve were advanced under direct visualization.   The robot was docked in the usual fashion.  The an incision was made over the most dependent portion of the cyst with monopolar cautery.  Using sharp and blunt dissection, the cyst was enucleated.  Adequate hemostasis was noted.  The cyst fluid was drained.  The cyst wall was placed in an endocatch bag and removed from the abdomen. The pelvis was irrigated.   The instruments were then removed under direct visualization and the robotic ports removed.  The robot was undocked.  Deep, subcutaneous, figure-of-eight 0-Vicryl sutures on a UR-6 needle were placed in the 10-12 mm supraumbilical and infracostal incisions.  All skin incisions were closed in a subcuticular fashion using 3-0 Monocryl.  Steri-strips and Benzoin were applied.  All instrument and needle counts were correct x  2.      

## 2013-10-17 NOTE — Anesthesia Preprocedure Evaluation (Signed)
Anesthesia Evaluation  Patient identified by MRN, date of birth, ID band Patient awake    Reviewed: Allergy & Precautions, H&P , Patient's Chart, lab work & pertinent test results, reviewed documented beta blocker date and time   History of Anesthesia Complications Negative for: history of anesthetic complications  Airway Mallampati: II  TM Distance: >3 FB Neck ROM: full    Dental   Pulmonary  breath sounds clear to auscultation        Cardiovascular Exercise Tolerance: Good Rhythm:regular Rate:Normal     Neuro/Psych    GI/Hepatic   Endo/Other    Renal/GU      Musculoskeletal   Abdominal   Peds  Hematology   Anesthesia Other Findings   Reproductive/Obstetrics                             Anesthesia Physical Anesthesia Plan  ASA: II  Anesthesia Plan: General ETT   Post-op Pain Management:    Induction:   Airway Management Planned:   Additional Equipment:   Intra-op Plan:   Post-operative Plan:   Informed Consent: I have reviewed the patients History and Physical, chart, labs and discussed the procedure including the risks, benefits and alternatives for the proposed anesthesia with the patient or authorized representative who has indicated his/her understanding and acceptance.   Dental Advisory Given  Plan Discussed with: CRNA and Surgeon  Anesthesia Plan Comments:         Anesthesia Quick Evaluation  

## 2013-10-17 NOTE — Discharge Instructions (Signed)
Laparoscopy  °Care After °Laparoscopic surgery is an operation done with a long, lighted tube inserted through a small cut (incision) in the abdomen. Read the instructions outlined below and refer to this sheet in the next few weeks. These instructions provide you with general information on caring for yourself after you leave the hospital. Your caregiver may also give you specific instructions. While your treatment has been planned according to the most current medical practices available, unavoidable complications may occur. If you have any problems or questions after discharge, please call your caregiver. °HOME CARE INSTRUCTIONS °· It will be normal to be sore for a couple days following surgery.  °· Take your medicine and follow the instructions from your caregiver.  °· You may resume usual diet, exercise, driving and activities as allowed by your caregiver.  °· Do not have sexual intercourse until your caregiver gives you permission.  °· Do not drive while taking pain medicine.  °· Avoid lifting until you are instructed otherwise.  °· Use showers for bathing, until you are seen by your caregiver.  °· Change dressings if needed, and as directed.  °· Only take over-the-counter or prescription medicines for pain, discomfort or fever as directed by your caregiver.  °· Do not take aspirin because it can cause bleeding.  °· Take your temperature twice a day and record it.  °· Have someone stay with you the day you have the operation, and for a couple days afterward.  °· Make an appointment to see your caregiver for stitches (sutures) or staple removal and postoperative exams, as instructed.  °SEEK MEDICAL CARE IF: °· There is redness, swelling, or increasing pain in a wound.  °· There is drainage from a wound lasting longer than one day.  °· Your pain is getting worse.  °· You develop a rash.  °· You are having a reaction to your medicine.  °· You become dizzy or lightheaded.  °· You need stronger medicine or a  change in your pain medicine.  °· You notice a foul smell coming from a wound or dressing.  °· There is a breaking open of a wound after the stitches, staples, or skin adhesive strips have been removed.  °· You develop constipation.  °SEEK IMMEDIATE MEDICAL CARE IF: °· You have an oral temperature above 100.6, not controlled by medicine.  °· There is increasing abdominal pain.  °· You develop pain in your shoulders (shoulder strap areas) which becomes more severe. Some pain is common, because of the gas inserted into your abdomen during the procedure.  °· You develop bleeding or drainage from the suture sites or vagina (birth canal) following surgery.  °· You pass out.  °· You develop shortness of breath or difficulty breathing.  °· You develop chest or leg pain.  °· You develop persistent nausea, vomiting or diarrhea.  °MAKE SURE YOU:  °· Understand these instructions.  °· Watch your condition.  °· Get help right away if you are not doing well or get worse.  °Document Released: 01/06/2005 Document Re-Released: 12/07/2009 °[employer]® Patient Information ©2011 [employer].  °

## 2013-10-18 MED FILL — Heparin Sodium (Porcine) Inj 5000 Unit/ML: INTRAMUSCULAR | Qty: 1 | Status: AC

## 2013-10-20 ENCOUNTER — Encounter (HOSPITAL_COMMUNITY): Payer: Self-pay | Admitting: Obstetrics & Gynecology

## 2013-10-29 ENCOUNTER — Encounter: Payer: Self-pay | Admitting: Obstetrics & Gynecology

## 2013-10-29 ENCOUNTER — Ambulatory Visit (INDEPENDENT_AMBULATORY_CARE_PROVIDER_SITE_OTHER): Payer: 59 | Admitting: Obstetrics & Gynecology

## 2013-10-29 VITALS — BP 107/70 | HR 90 | Temp 98.2°F | Wt 164.0 lb

## 2013-10-29 DIAGNOSIS — Z09 Encounter for follow-up examination after completed treatment for conditions other than malignant neoplasm: Secondary | ICD-10-CM | POA: Insufficient documentation

## 2013-10-29 NOTE — Progress Notes (Signed)
Subjective:     Tammy Gutierrez is a 23 y.o. female who presents to the clinic 2 weeks status post robotic-assisted removal of a paratubal cyst for adnexal mass. Eating a regular diet without difficulty. Bowel movements are normal. Pain is controlled without any medications.  The following portions of the patient's history were reviewed and updated as appropriate: allergies, current medications, past family history, past medical history, past social history and past surgical history.  Review of Systems Pertinent items are noted in HPI.    Objective:    BP 107/70  Pulse 90  Temp(Src) 98.2 F (36.8 C)  Wt 74.39 kg (164 lb)  LMP 10/06/2013 General:  alert  Abdomen: soft, bowel sounds active, non-tender  Incision:   healing well, no drainage, no erythema, no hernia, no seroma, no swelling, no dehiscence, incision well approximated       Assessment:    Doing well postoperatively. Operative findings again reviewed. Pathology report discussed.    Plan:    1. Continue any current medications. 2. Wound care discussed. 3. Activity restrictions: no intercourse 4. Anticipated return to work: now. 5. Follow up: 4 weeks.

## 2013-11-06 ENCOUNTER — Encounter: Payer: 59 | Admitting: Obstetrics & Gynecology

## 2013-11-27 ENCOUNTER — Ambulatory Visit (INDEPENDENT_AMBULATORY_CARE_PROVIDER_SITE_OTHER): Payer: 59 | Admitting: Obstetrics & Gynecology

## 2013-11-27 ENCOUNTER — Encounter: Payer: Self-pay | Admitting: Obstetrics & Gynecology

## 2013-11-27 VITALS — BP 105/68 | HR 76 | Temp 98.7°F | Ht 65.0 in | Wt 168.0 lb

## 2013-11-27 DIAGNOSIS — Z09 Encounter for follow-up examination after completed treatment for conditions other than malignant neoplasm: Secondary | ICD-10-CM

## 2013-11-27 NOTE — Progress Notes (Signed)
Subjective:     Yaelis Pepi is a 23 y.o. female who presents to the clinic 4 weeks status post robotic-assisted removal of a paratubal cyst for adnexal mass. Eating a regular diet without difficulty. Bowel movements are normal. Pain is controlled without any medications.  The following portions of the patient's history were reviewed and updated as appropriate: allergies, current medications, past family history, past medical history, past social history and past surgical history.  Review of Systems Pertinent items are noted in HPI.    Objective:    BP 105/68  Pulse 76  Temp(Src) 98.7 F (37.1 C)  Ht 5\' 5"  (1.651 m)  Wt 76.204 kg (168 lb)  BMI 27.96 kg/m2  LMP 10/28/2013 General:  alert  Abdomen: soft, bowel sounds active, non-tender  Incision:   healing well, no drainage, no erythema, no hernia, no seroma, no swelling, no dehiscence, incision well approximated; suture removed from LUQ incision       Assessment:    Doing well postoperatively.   Plan:    Follow up: 4 weeks.

## 2013-12-31 ENCOUNTER — Ambulatory Visit: Payer: 59 | Admitting: Obstetrics & Gynecology

## 2014-02-06 MED ORDER — HYDROXYZINE 25 MG TAB
25 mg | ORAL_TABLET | Freq: Four times a day (QID) | ORAL | Status: AC | PRN
Start: 2014-02-06 — End: 2014-02-16

## 2014-02-06 MED ORDER — CEPHALEXIN 500 MG CAP
500 mg | ORAL_CAPSULE | Freq: Four times a day (QID) | ORAL | Status: AC
Start: 2014-02-06 — End: 2014-02-13

## 2014-02-06 MED ORDER — METHYLPREDNISOLONE 4 MG TABS IN A DOSE PACK
4 mg | PACK | ORAL | Status: AC
Start: 2014-02-06 — End: ?

## 2014-02-06 MED ADMIN — methylPREDNISolone (PF) (SOLU-MEDROL) injection 125 mg: INTRAMUSCULAR | @ 20:00:00 | NDC 00009004722

## 2014-02-06 NOTE — Other (Signed)
Patient is a 23 y.o. female presenting with rash. The history is provided by the patient.   Rash   This is a new problem. Episode onset: one week with redness, hives, and bulla type vesicles to bilateral lower legs, right wrist, neck and face.  The problem has not changed since onset.The problem is associated with nothing. There has been no fever. The rash is present on the left ankle, right ankle, right lower leg, left lower leg and right wrist. The pain is at a severity of 8/10. The pain is moderate. Associated symptoms include blisters, itching, pain, weeping and hives. Treatments tried: anti itch gel. The treatment provided no relief.        No past medical history on file.     No past surgical history on file.      No family history on file.     History     Social History   ??? Marital Status: SINGLE     Spouse Name: N/A     Number of Children: N/A   ??? Years of Education: N/A     Occupational History   ??? Not on file.     Social History Main Topics   ??? Smoking status: Not on file   ??? Smokeless tobacco: Not on file   ??? Alcohol Use: Not on file   ??? Drug Use: Not on file   ??? Sexual Activity: Not on file     Other Topics Concern   ??? Not on file     Social History Narrative   ??? No narrative on file                ALLERGIES: Review of patient's allergies indicates no known allergies.    Review of Systems   Skin: Positive for itching and rash.   All other systems reviewed and are negative.      Filed Vitals:    02/06/14 1550   BP: 104/63   Pulse: 74   Temp: 98 ??F (36.7 ??C)   Resp: 16   Height: 5\' 5"  (1.651 m)   Weight: 75.751 kg (167 lb)   SpO2: 99%       Physical Exam   Constitutional: She is oriented to person, place, and time. She appears well-developed and well-nourished.   HENT:   Head: Normocephalic and atraumatic.   Eyes: Pupils are equal, round, and reactive to light.   Neck: Normal range of motion. Neck supple.   Cardiovascular: Normal rate and regular rhythm.     Pulmonary/Chest: Effort normal and breath sounds normal. No respiratory distress. She has no wheezes.   Musculoskeletal: Normal range of motion.   Lymphadenopathy:     She has no cervical adenopathy.   Neurological: She is alert and oriented to person, place, and time.   Skin: Skin is warm and dry.    redness, swelling, hives, and bulla type vesicles to bilateral lower legs,right wrist, neck and face.   Right ankle with more swelling than left. Bilateral lower legs with redness swelling and multiple bullae to both lower legs some with drainage.  Skin warm  +pulses to lower extremities   Psychiatric: She has a normal mood and affect. Her behavior is normal.   Vitals reviewed.      MDM     Differential Diagnosis; Clinical Impression; Plan:     Poison Ivy. IM solumedrol, Dosepak, Keflex, atarax       Procedures

## 2014-02-09 ENCOUNTER — Emergency Department (INDEPENDENT_AMBULATORY_CARE_PROVIDER_SITE_OTHER)
Admission: EM | Admit: 2014-02-09 | Discharge: 2014-02-09 | Disposition: A | Discharge status: Home / Self Care | Payer: BC Managed Care – PPO | Source: Home / Self Care

## 2014-02-09 ENCOUNTER — Encounter (HOSPITAL_COMMUNITY): Payer: Self-pay | Admitting: Emergency Medicine

## 2014-02-09 DIAGNOSIS — L255 Unspecified contact dermatitis due to plants, except food: Secondary | ICD-10-CM

## 2014-02-09 MED ORDER — METHYLPREDNISOLONE SODIUM SUCC 125 MG IJ SOLR
80.0000 mg | Freq: Once | INTRAMUSCULAR | Status: AC
  Administered 2014-02-09: 80 mg via INTRAMUSCULAR

## 2014-02-09 MED ORDER — METHYLPREDNISOLONE SODIUM SUCC 125 MG IJ SOLR
INTRAMUSCULAR | Status: AC
  Filled 2014-02-09: qty 2

## 2014-02-09 MED ORDER — TRIAMCINOLONE ACETONIDE 0.1 % EX CREA: 1.0000 "application " | TOPICAL_CREAM | Freq: Two times a day (BID) | CUTANEOUS | Status: DC

## 2014-02-09 NOTE — ED Provider Notes (Signed)
CSN: [id number]     Arrival date & time 02/09/14  1335 History   First MD Initiated Contact with Patient 02/09/14 1521     Chief Complaint  Patient presents with  . Skin Problem   (Consider location/radiation/quality/duration/timing/severity/associated sxs/prior Treatment) HPI Comments: 23 y o f cleaning in a yard 2 weeks ago and developed small red pruritic papules primarily to the lower legs. In the first few days formed large vesicular lesions as well as areas similar to eczema on the legs and volar aspects of arms. N other areas affected.  3 d ago went to t urgent care and tx with steroid injection, pred taper , atarax. Compliant with meds. Not much better per pt.   History reviewed. No pertinent past medical history. Past Surgical History  Procedure Laterality Date  . Wisdom tooth extraction    . Robotic assisted laparoscopic ovarian cystectomy N/A 10/17/2013    Procedure: ROBOTIC ASSISTED LAPAROSCOPIC PARATUBAL CYSTECTOMY;  Surgeon: Antionette CharLisa Jackson-Moore, MD;  Location: [facility];  Service: Gynecology;  Laterality: N/A;   Family History  Problem Relation Age of Onset  . Depression Mother   . Cancer Neg Hx   . Heart disease Neg Hx   . Diabetes Neg Hx   . Hypertension Neg Hx    History  Substance Use Topics  . Smoking status: Never Smoker   . Smokeless tobacco: Never Used  . Alcohol Use: No   OB History   Grav Para Term Preterm Abortions TAB SAB Ect Mult Living   0 0 0 0 0 0 0 0 0 0      Review of Systems  Constitutional: Negative.   HENT: Negative.   Respiratory: Negative.   Skin: Positive for color change and rash.  Neurological: Negative.     Allergies  Review of patient's allergies indicates no known allergies.  Home Medications   Prior to Admission medications   Medication Sig Start Date End Date Taking? Authorizing Provider  triamcinolone cream (KENALOG) 0.1 % Apply 1 application topically 2 (two) times daily. 02/09/14   Hayden Rasmussenavid Capria Cartaya, NP   BP 106/67  Pulse 75   Temp(Src) 98.3 F (36.8 C) (Oral)  Resp 16  SpO2 97% Physical Exam  Nursing note and vitals reviewed. Constitutional: She is oriented to person, place, and time. She appears well-developed and well-nourished. No distress.  Eyes: Conjunctivae and EOM are normal.  Neck: Normal range of motion. Neck supple.  Cardiovascular: Normal rate.   Pulmonary/Chest: Effort normal. No respiratory distress.  Musculoskeletal: She exhibits edema.  Mild edema to the feet and ankles.  Neurological: She is alert and oriented to person, place, and time.  Skin: Skin is warm and dry.  Papulovesicular rash with several large vesicles filled with clear liquid with larger underlying rubor, well marginated. Areas of ruptured vesicles. No signs of infection, no lymphangitis.     ED Course  Procedures (including critical care time) Labs Review Labs Reviewed - No data to display  Imaging Review No results found.   MDM   1. Contact dermatitis due to plants, except food     Solumedrol 80 mg IM Topical triamcinolone cream bid For fever or red streaks rechk promptly.    Hayden Rasmussenavid Brittan Mapel, NP 02/09/14 1555

## 2014-02-09 NOTE — ED Notes (Signed)
Problem x 2 weeks, no better w Rx steriod dose pack , atarax, and cephalexin. Has swollen red draining areas both legs, abdominal area, both arms

## 2014-02-09 NOTE — Discharge Instructions (Signed)
Contact Dermatitis °Contact dermatitis is a reaction to certain substances that touch the skin. Contact dermatitis can be either irritant contact dermatitis or allergic contact dermatitis. Irritant contact dermatitis does not require previous exposure to the substance for a reaction to occur. Allergic contact dermatitis only occurs if you have been exposed to the substance before. Upon a repeat exposure, your body reacts to the substance.  °CAUSES  °Many substances can cause contact dermatitis. Irritant dermatitis is most commonly caused by repeated exposure to mildly irritating substances, such as: °· Makeup. °· Soaps. °· Detergents. °· Bleaches. °· Acids. °· Metal salts, such as nickel. °Allergic contact dermatitis is most commonly caused by exposure to: °· Poisonous plants. °· Chemicals (deodorants, shampoos). °· Jewelry. °· Latex. °· Neomycin in triple antibiotic cream. °· Preservatives in products, including clothing. °SYMPTOMS  °The area of skin that is exposed may develop: °· Dryness or flaking. °· Redness. °· Cracks. °· Itching. °· Pain or a burning sensation. °· Blisters. °With allergic contact dermatitis, there may also be swelling in areas such as the eyelids, mouth, or genitals.  °DIAGNOSIS  °Your caregiver can usually tell what the problem is by doing a physical exam. In cases where the cause is uncertain and an allergic contact dermatitis is suspected, a patch skin test may be performed to help determine the cause of your dermatitis. °TREATMENT °Treatment includes protecting the skin from further contact with the irritating substance by avoiding that substance if possible. Barrier creams, powders, and gloves may be helpful. Your caregiver may also recommend: °· Steroid creams or ointments applied 2 times daily. For best results, soak the rash area in cool water for 20 minutes. Then apply the medicine. Cover the area with a plastic wrap. You can store the steroid cream in the refrigerator for a "chilly"  effect on your rash. That may decrease itching. Oral steroid medicines may be needed in more severe cases. °· Antibiotics or antibacterial ointments if a skin infection is present. °· Antihistamine lotion or an antihistamine taken by mouth to ease itching. °· Lubricants to keep moisture in your skin. °· Burow's solution to reduce redness and soreness or to dry a weeping rash. Mix one packet or tablet of solution in 2 cups cool water. Dip a clean washcloth in the mixture, wring it out a bit, and put it on the affected area. Leave the cloth in place for 30 minutes. Do this as often as possible throughout the day. °· Taking several cornstarch or baking soda baths daily if the area is too large to cover with a washcloth. °Harsh chemicals, such as alkalis or acids, can cause skin damage that is like a burn. You should flush your skin for 15 to 20 minutes with cold water after such an exposure. You should also seek immediate medical care after exposure. Bandages (dressings), antibiotics, and pain medicine may be needed for severely irritated skin.  °HOME CARE INSTRUCTIONS °· Avoid the substance that caused your reaction. °· Keep the area of skin that is affected away from hot water, soap, sunlight, chemicals, acidic substances, or anything else that would irritate your skin. °· Do not scratch the rash. Scratching may cause the rash to become infected. °· You may take cool baths to help stop the itching. °· Only take over-the-counter or prescription medicines as directed by your caregiver. °· See your caregiver for follow-up care as directed to make sure your skin is healing properly. °SEEK MEDICAL CARE IF:  °· Your condition is not better after 3   days of treatment. °· You seem to be getting worse. °· You see signs of infection such as swelling, tenderness, redness, soreness, or warmth in the affected area. °· You have any problems related to your medicines. °Document Released: 06/16/2000 Document Revised: 09/11/2011  Document Reviewed: 11/22/2010 °ExitCare® Patient Information ©2015 [employer]. This information is not intended to replace advice given to you by your health care provider. Make sure you discuss any questions you have with your health care provider. ° °Poison Ivy °Poison ivy is a inflammation of the skin (contact dermatitis) caused by touching the allergens on the leaves of the ivy plant following previous exposure to the plant. The rash usually appears 48 hours after exposure. The rash is usually bumps (papules) or blisters (vesicles) in a linear pattern. Depending on your own sensitivity, the rash may simply cause redness and itching, or it may also progress to blisters which may break open. These must be well cared for to prevent secondary bacterial (germ) infection, followed by scarring. Keep any open areas dry, clean, dressed, and covered with an antibacterial ointment if needed. The eyes may also get puffy. The puffiness is worst in the morning and gets better as the day progresses. This dermatitis usually heals without scarring, within 2 to 3 weeks without treatment. °HOME CARE INSTRUCTIONS  °Thoroughly wash with soap and water as soon as you have been exposed to poison ivy. You have about one half hour to remove the plant resin before it will cause the rash. This washing will destroy the oil or antigen on the skin that is causing, or will cause, the rash. Be sure to wash under your fingernails as any plant resin there will continue to spread the rash. Do not rub skin vigorously when washing affected area. Poison ivy cannot spread if no oil from the plant remains on your body. A rash that has progressed to weeping sores will not spread the rash unless you have not washed thoroughly. It is also important to wash any clothes you have been wearing as these may carry active allergens. The rash will return if you wear the unwashed clothing, even several days later. °Avoidance of the plant in the future is the  best measure. Poison ivy plant can be recognized by the number of leaves. Generally, poison ivy has three leaves with flowering branches on a single stem. °Diphenhydramine may be purchased over the counter and used as needed for itching. Do not drive with this medication if it makes you drowsy.Ask your caregiver about medication for children. °SEEK MEDICAL CARE IF: °· Open sores develop. °· Redness spreads beyond area of rash. °· You notice purulent (pus-like) discharge. °· You have increased pain. °· Other signs of infection develop (such as fever). °Document Released: 06/16/2000 Document Revised: 09/11/2011 Document Reviewed: 11/27/2008 °ExitCare® Patient Information ©2015 [employer]. This information is not intended to replace advice given to you by your health care provider. Make sure you discuss any questions you have with your health care provider. ° °

## 2014-02-09 NOTE — ED Provider Notes (Signed)
Medical screening examination/treatment/procedure(s) were performed by resident physician or non-physician practitioner and as supervising physician I was immediately available for consultation/collaboration.   Ringo Sherod DOUGLAS MD.   Vergil Burby D Kaizley Aja, MD 02/09/14 1843 

## 2014-06-29 ENCOUNTER — Encounter: Payer: Self-pay | Admitting: *Deleted

## 2014-06-30 ENCOUNTER — Encounter: Payer: Self-pay | Admitting: Obstetrics & Gynecology

## 2014-07-03 NOTE — L&D Delivery Note (Signed)
Delivery Note At 9:14 PM a viable female "Tammy Gutierrez" was delivered via Vaginal, Spontaneous Delivery (Presentation: Occiput Anterior w/ compound right hand). APGARS: 8, 9; weight 7 lb 1.6 oz (3220 g).   Placenta status: Intact, Spontaneous.  Cord: 3 vessels with the following complications: None.  Cord pH: NA  Anesthesia: Local for repair Episiotomy: None Lacerations: 2nd degree Perineal  Suture Repair: 3.0 vicryl CT-1 and SH Est. Blood Loss (mL): 540  Mom to postpartum.  Baby to Couplet care / Skin to Skin.  Pt meets criteria for Stage 1 PPH - will initiate per protocol.   Mom plans to breastfeed.  Desires Micronor for contraception.  [name] 01/19/2015, 10:22 PM  ADDENDUM: Results for orders placed or performed during the hospital encounter of 01/19/15 (from the past 24 hour(s))  Type and screen     Status: None   Collection Time: 01/19/15  3:44 PM  Result Value Ref Range   ABO/RH(D) O POS    Antibody Screen NEG    Sample Expiration 01/22/2015   CBC     Status: Abnormal   Collection Time: 01/19/15  4:42 PM  Result Value Ref Range   WBC 14.7 (H) 4.0 - 10.5 K/uL   RBC 3.75 (L) 3.87 - 5.11 MIL/uL   Hemoglobin 10.7 (L) 12.0 - 15.0 g/dL   HCT 96.0 (L) 45.4 - 09.8 %   MCV 86.7 78.0 - 100.0 fL   MCH 28.5 26.0 - 34.0 pg   MCHC 32.9 30.0 - 36.0 g/dL   RDW 11.9 14.7 - 82.9 %   Platelets 238 150 - 400 K/uL  ABO/Rh     Status: None   Collection Time: 01/19/15  4:42 PM  Result Value Ref Range   ABO/RH(D) O POS   Comprehensive metabolic panel     Status: Abnormal   Collection Time: 01/19/15  4:42 PM  Result Value Ref Range   Sodium 135 135 - 145 mmol/L   Potassium 4.1 3.5 - 5.1 mmol/L   Chloride 108 101 - 111 mmol/L   CO2 21 (L) 22 - 32 mmol/L   Glucose, Bld 90 65 - 99 mg/dL   BUN 10 6 - 20 mg/dL   Creatinine, Ser 5.62 0.44 - 1.00 mg/dL   Calcium 8.7 (L) 8.9 - 10.3 mg/dL   Total Protein 6.2 (L) 6.5 - 8.1 g/dL   Albumin 2.9 (L) 3.5 - 5.0 g/dL   AST 24 15 - 41 U/L   ALT 12 (L) 14 - 54 U/L   Alkaline Phosphatase 153 (H) 38 - 126 U/L   Total Bilirubin 0.4 0.3 - 1.2 mg/dL   GFR calc non Af Amer >60 >60 mL/min   GFR calc Af Amer >60 >60 mL/min   Anion gap 6 5 - 15  Lactate dehydrogenase     Status: None   Collection Time: 01/19/15  4:42 PM  Result Value Ref Range   LDH 155 98 - 192 U/L  Uric acid     Status: None   Collection Time: 01/19/15  4:42 PM  Result Value Ref Range   Uric Acid, Serum 5.1 2.3 - 6.6 mg/dL  CBC     Status: Abnormal   Collection Time: 01/20/15 12:31 AM  Result Value Ref Range   WBC 28.4 (H) 4.0 - 10.5 K/uL   RBC 3.20 (L) 3.87 - 5.11 MIL/uL   Hemoglobin 9.2 (L) 12.0 - 15.0 g/dL   HCT 13.0 (L) 86.5 - 78.4 %   MCV 85.6 78.0 - 100.0  fL   MCH 28.8 26.0 - 34.0 pg   MCHC 33.6 30.0 - 36.0 g/dL   RDW 16.114.7 09.611.5 - 04.515.5 %   Platelets 246 150 - 400 K/uL    Sherre ScarletKimberly Carah Barrientes, CNM 01/20/15, 6:30 AM

## 2014-08-05 LAB — OB RESULTS CONSOLE RPR: RPR: NONREACTIVE

## 2014-08-05 LAB — OB RESULTS CONSOLE GC/CHLAMYDIA
Chlamydia: NEGATIVE
GC PROBE AMP, GENITAL: NEGATIVE

## 2014-08-05 LAB — OB RESULTS CONSOLE RUBELLA ANTIBODY, IGM: Rubella: IMMUNE

## 2014-08-05 LAB — OB RESULTS CONSOLE HEPATITIS B SURFACE ANTIGEN: HEP B S AG: NEGATIVE

## 2014-08-05 LAB — OB RESULTS CONSOLE HIV ANTIBODY (ROUTINE TESTING): HIV: NONREACTIVE

## 2014-12-23 LAB — OB RESULTS CONSOLE GBS: GBS: POSITIVE

## 2014-12-23 LAB — OB RESULTS CONSOLE GC/CHLAMYDIA
Chlamydia: NEGATIVE
Gonorrhea: NEGATIVE

## 2015-01-19 ENCOUNTER — Encounter (HOSPITAL_COMMUNITY): Payer: Self-pay | Admitting: *Deleted

## 2015-01-19 ENCOUNTER — Inpatient Hospital Stay (HOSPITAL_COMMUNITY)
Admission: AD | Admit: 2015-01-19 | Discharge: 2015-01-21 | DRG: 775 | Disposition: A | Discharge status: Home / Self Care | Payer: Medicaid Other | Source: Ambulatory Visit | Attending: Obstetrics and Gynecology | Admitting: Obstetrics and Gynecology

## 2015-01-19 DIAGNOSIS — Z3A39 39 weeks gestation of pregnancy: Secondary | ICD-10-CM | POA: Diagnosis present

## 2015-01-19 DIAGNOSIS — O99824 Streptococcus B carrier state complicating childbirth: Secondary | ICD-10-CM | POA: Diagnosis present

## 2015-01-19 DIAGNOSIS — O9902 Anemia complicating childbirth: Secondary | ICD-10-CM | POA: Diagnosis present

## 2015-01-19 DIAGNOSIS — Z6841 Body Mass Index (BMI) 40.0 and over, adult: Secondary | ICD-10-CM

## 2015-01-19 HISTORY — DX: Personal history of other diseases of the female genital tract: Z87.42

## 2015-01-19 HISTORY — DX: Unspecified ovarian cyst, unspecified side: N83.209

## 2015-01-19 HISTORY — DX: Other specified postprocedural states: Z98.890

## 2015-01-19 HISTORY — DX: Anemia, unspecified: D64.9

## 2015-01-19 HISTORY — DX: Partial loss of teeth, unspecified cause, unspecified class: K08.409

## 2015-01-19 LAB — COMPREHENSIVE METABOLIC PANEL
ALK PHOS: 153 U/L — AB (ref 38–126)
ALT: 12 U/L — ABNORMAL LOW (ref 14–54)
AST: 24 U/L (ref 15–41)
Albumin: 2.9 g/dL — ABNORMAL LOW (ref 3.5–5.0)
Anion gap: 6 (ref 5–15)
BUN: 10 mg/dL (ref 6–20)
CALCIUM: 8.7 mg/dL — AB (ref 8.9–10.3)
CO2: 21 mmol/L — ABNORMAL LOW (ref 22–32)
Chloride: 108 mmol/L (ref 101–111)
Creatinine, Ser: 0.73 mg/dL (ref 0.44–1.00)
GFR calc Af Amer: >60 mL/min (ref >60)
GFR calc non Af Amer: >60 mL/min (ref >60)
GLUCOSE: 90 mg/dL (ref 65–99)
POTASSIUM: 4.1 mmol/L (ref 3.5–5.1)
Sodium: 135 mmol/L (ref 135–145)
Total Bilirubin: 0.4 mg/dL (ref 0.3–1.2)
Total Protein: 6.2 g/dL — ABNORMAL LOW (ref 6.5–8.1)

## 2015-01-19 LAB — LACTATE DEHYDROGENASE: LDH: 155 U/L (ref 98–192)

## 2015-01-19 LAB — TYPE AND SCREEN
ABO/RH(D): O POS
Antibody Screen: NEGATIVE

## 2015-01-19 LAB — CBC
HCT: 32.5 % — ABNORMAL LOW (ref 36.0–46.0)
Hemoglobin: 10.7 g/dL — ABNORMAL LOW (ref 12.0–15.0)
MCH: 28.5 pg (ref 26.0–34.0)
MCHC: 32.9 g/dL (ref 30.0–36.0)
MCV: 86.7 fL (ref 78.0–100.0)
Platelets: 238 10*3/uL (ref 150–400)
RBC: 3.75 MIL/uL — ABNORMAL LOW (ref 3.87–5.11)
RDW: 14.8 % (ref 11.5–15.5)
WBC: 14.7 10*3/uL — AB (ref 4.0–10.5)

## 2015-01-19 LAB — URIC ACID: Uric Acid, Serum: 5.1 mg/dL (ref 2.3–6.6)

## 2015-01-19 MED ORDER — CITRIC ACID-SODIUM CITRATE 334-500 MG/5ML PO SOLN: 30.0000 mL | ORAL | Status: DC | PRN

## 2015-01-19 MED ORDER — FENTANYL CITRATE (PF) 100 MCG/2ML IJ SOLN: 100.0000 ug | INTRAMUSCULAR | Status: DC | PRN

## 2015-01-19 MED ORDER — ONDANSETRON HCL 4 MG/2ML IJ SOLN: 4.0000 mg | Freq: Four times a day (QID) | INTRAMUSCULAR | Status: DC | PRN

## 2015-01-19 MED ORDER — PENICILLIN G POTASSIUM 5000000 UNITS IJ SOLR
2.5000 10*6.[IU] | INTRAVENOUS | Status: DC
  Administered 2015-01-19: 2.5 10*6.[IU] via INTRAVENOUS
  Filled 2015-01-19 (×6): qty 2.5

## 2015-01-19 MED ORDER — OXYCODONE-ACETAMINOPHEN 5-325 MG PO TABS: 2.0000 | ORAL_TABLET | ORAL | Status: DC | PRN

## 2015-01-19 MED ORDER — LIDOCAINE HCL (PF) 1 % IJ SOLN
30.0000 mL | INTRAMUSCULAR | Status: DC | PRN
  Administered 2015-01-19: 30 mL via SUBCUTANEOUS
  Filled 2015-01-19: qty 30

## 2015-01-19 MED ORDER — FLEET ENEMA 7-19 GM/118ML RE ENEM: 1.0000 | ENEMA | RECTAL | Status: DC | PRN

## 2015-01-19 MED ORDER — ACETAMINOPHEN 325 MG PO TABS: 650.0000 mg | ORAL_TABLET | ORAL | Status: DC | PRN

## 2015-01-19 MED ORDER — FERROUS SULFATE 325 (65 FE) MG PO TABS
325.0000 mg | ORAL_TABLET | Freq: Two times a day (BID) | ORAL | Status: DC
  Administered 2015-01-20 – 2015-01-21 (×3): 325 mg via ORAL
  Filled 2015-01-19 (×3): qty 1

## 2015-01-19 MED ORDER — LACTATED RINGERS IV SOLN
INTRAVENOUS | Status: DC
  Administered 2015-01-19: 16:00:00 via INTRAVENOUS

## 2015-01-19 MED ORDER — IBUPROFEN 600 MG PO TABS
600.0000 mg | ORAL_TABLET | Freq: Four times a day (QID) | ORAL | Status: DC
  Administered 2015-01-20 – 2015-01-21 (×6): 600 mg via ORAL
  Filled 2015-01-19 (×6): qty 1

## 2015-01-19 MED ORDER — OXYCODONE-ACETAMINOPHEN 5-325 MG PO TABS
1.0000 | ORAL_TABLET | ORAL | Status: DC | PRN
  Administered 2015-01-19: 1 via ORAL
  Filled 2015-01-19: qty 1

## 2015-01-19 MED ORDER — OXYTOCIN BOLUS FROM INFUSION
500.0000 mL | INTRAVENOUS | Status: DC
  Administered 2015-01-19: 500 mL via INTRAVENOUS

## 2015-01-19 MED ORDER — LACTATED RINGERS IV SOLN: 500.0000 mL | INTRAVENOUS | Status: DC | PRN

## 2015-01-19 MED ORDER — OXYTOCIN 40 UNITS IN LACTATED RINGERS INFUSION - SIMPLE MED
62.5000 mL/h | INTRAVENOUS | Status: DC
  Filled 2015-01-19: qty 1000

## 2015-01-19 MED ORDER — PENICILLIN G POTASSIUM 5000000 UNITS IJ SOLR
5.0000 10*6.[IU] | Freq: Once | INTRAMUSCULAR | Status: AC
  Administered 2015-01-19: 5 10*6.[IU] via INTRAVENOUS
  Filled 2015-01-19: qty 5

## 2015-01-19 NOTE — MAU Note (Signed)
Pt. Transferred to 171 for admission.

## 2015-01-19 NOTE — MAU Note (Signed)
[facility] Will call back to MAU with room number

## 2015-01-19 NOTE — Progress Notes (Signed)
Pt's cervix checked per her request

## 2015-01-19 NOTE — Progress Notes (Signed)
Dr Estanislado Pandyivard notified of pt's admission and status. Aware of sve, ctx pattern, FHR reactive, sl elevated B/Ps. WIll admit to BS. Orders received

## 2015-01-19 NOTE — Progress Notes (Addendum)
Subjective: Assuming care of Tammy Gutierrez, 24 yo G1P0 @ 39.6 wks admitted in early labor and SROM at ~ 6 pm. Pushing uncontrollably. Family at bedside.  Denies h/a, visual changes, RUQ pain or difficulty breathing.  Objective: BP 148/81 mmHg  Pulse 77  Temp(Src) 98.8 F (37.1 C) (Oral)  Resp 22  Ht 5' 3.5" (1.613 m)  Wt 236 lb (107.049 kg)  BMI 41.14 kg/m2     Today's Vitals   01/19/15 1800 01/19/15 1830 01/19/15 1900 01/19/15 1906  BP:    148/81  Pulse:    77  Temp:    98.8 F (37.1 C)  TempSrc:    Oral  Resp:    22  Height:      Weight:      PainSc: 10-Worst pain ever 10-Worst pain ever 10-Worst pain ever    Lungs: CTAB CV: RRR w/o M/R/G FHT: BL 145 w/ moderate variability, +accels, earlys, occ variable UC:   irregular, every 2-3 minutes SVE: 8/C/0 Ext: Trace calf/pedal edema. 1+ DTRs bilaterally, no clonus Continues to leak clear fluid  Assessment:  IUP at 39.6 wks GBS positive -- adequately treated Active labor Cat 1 FHRT Mildly elevated BPs  Plan: Desires NCB -- provide support as needed Baseline preE labs Anticipate progress and SVD   [name] CNM 01/19/2015, 7:29 PM  ADDENDUM:  Results for orders placed or performed during the hospital encounter of 01/19/15 (from the past 24 hour(s))  Type and screen     Status: None   Collection Time: 01/19/15  3:44 PM  Result Value Ref Range   ABO/RH(D) O POS    Antibody Screen NEG    Sample Expiration 01/22/2015   CBC     Status: Abnormal   Collection Time: 01/19/15  4:42 PM  Result Value Ref Range   WBC 14.7 (H) 4.0 - 10.5 K/uL   RBC 3.75 (L) 3.87 - 5.11 MIL/uL   Hemoglobin 10.7 (L) 12.0 - 15.0 g/dL   HCT 69.6 (L) 29.5 - 28.4 %   MCV 86.7 78.0 - 100.0 fL   MCH 28.5 26.0 - 34.0 pg   MCHC 32.9 30.0 - 36.0 g/dL   RDW 13.2 44.0 - 10.2 %   Platelets 238 150 - 400 K/uL  Comprehensive metabolic panel     Status: Abnormal   Collection Time: 01/19/15  4:42 PM  Result Value Ref Range   Sodium 135 135 - 145 mmol/L   Potassium 4.1 3.5 - 5.1 mmol/L   Chloride 108 101 - 111 mmol/L   CO2 21 (L) 22 - 32 mmol/L   Glucose, Bld 90 65 - 99 mg/dL   BUN 10 6 - 20 mg/dL   Creatinine, Ser 7.25 0.44 - 1.00 mg/dL   Calcium 8.7 (L) 8.9 - 10.3 mg/dL   Total Protein 6.2 (L) 6.5 - 8.1 g/dL   Albumin 2.9 (L) 3.5 - 5.0 g/dL   AST 24 15 - 41 U/L   ALT 12 (L) 14 - 54 U/L   Alkaline Phosphatase 153 (H) 38 - 126 U/L   Total Bilirubin 0.4 0.3 - 1.2 mg/dL   GFR calc non Af Amer >60 >60 mL/min   GFR calc Af Amer >60 >60 mL/min   Anion gap 6 5 - 15  Lactate dehydrogenase     Status: None   Collection Time: 01/19/15  4:42 PM  Result Value Ref Range   LDH 155 98 - 192 U/L  Uric acid     Status: None  Collection Time: 01/19/15  4:42 PM  Result Value Ref Range   Uric Acid, Serum 5.1 2.3 - 6.6 mg/dL   Normal labs   Sherre ScarletKimberly Tawny Raspberry, CNM 01/19/15, 10:58 PM

## 2015-01-19 NOTE — MAU Note (Signed)
Ctx's started at 0600, now every 2-3. Getting closer and stronger.  No recent exams.  Missed last wk's appt

## 2015-01-19 NOTE — H&P (Signed)
Tammy Gutierrez is a 24 y.o. female G1P0  At 39+5 weeks presenting for regular uterine contractions.  Pregnancy followed at [facility] since 16+5   weeks and remarkable for:  GBS positive  OB History    Gravida Para Term Preterm AB TAB SAB Ectopic Multiple Living   1 0 0 0 0 0 0 0 0 0      Past Medical History  Diagnosis Date  . Ovarian cyst    Past Surgical History  Procedure Laterality Date  . Wisdom tooth extraction    . Robotic assisted laparoscopic ovarian cystectomy N/A 10/17/2013    Procedure: ROBOTIC ASSISTED LAPAROSCOPIC PARATUBAL CYSTECTOMY;  Surgeon: Antionette CharLisa Jackson-Moore, MD;  Location: [facility];  Service: Gynecology;  Laterality: N/A;    Family History:   family history includes Depression in her mother. There is no history of Cancer, Heart disease, Diabetes, or Hypertension. Social History:    reports that she has never smoked. She has never used smokeless tobacco. She reports that she does not drink alcohol or use illicit drugs.   Prenatal labs: ABO, Rh: --/--/O POS (07/19 1544) Antibody: NEG (07/19 1544) Rubella:  immune RPR: Nonreactive (02/03 0000)  HBsAg: Negative (02/03 0000)  HIV: Non-reactive (02/03 0000)  GBS: Positive (06/22 0000)  Panorama: normal AFP: normal   Prenatal Transfer Tool  Maternal Diabetes: No Genetic Screening: Normal Maternal Ultrasounds/Referrals: Normal Fetal Ultrasounds or other Referrals:  None Maternal Substance Abuse:  No Significant Maternal Medications:  None Significant Maternal Lab Results: None   Dilation: 4 Effacement (%): 90 Station: -1 Exam by:: Quintella BatonJo Barham RNC Blood pressure 134/81, pulse 73, temperature 98.5 F (36.9 C), temperature source Oral, resp. rate 18, height 5' 3.5" (1.613 m), weight 236 lb (107.049 kg).  General Appearance: Alert, appropriate appearance for age. No acute distress HEENT Exam: Grossly normal Chest/Respiratory Exam: Normal chest wall and respirations. Clear to auscultation  Cardiovascular  Exam: Regular rate and rhythm. S1, S2, no murmur Gastrointestinal Exam: soft, non-tender, Uterus gravid with size compatible with GA, Vertex presentation by Leopold's maneuvers Psychiatric Exam: Alert and oriented, appropriate affect  ++++++++++++++++++++++++++++++++++++++++++++++++++++++++++++++++  Vaginal exam: 8/100/0  With evidence of SROM likely around 6:30 pm  Fetal tracings: Category 1  ++++++++++++++++++++++++++++++++++++++++++++++++++++++++++++++++   Assessment/Plan:  Term IUP in active labor with imminent delivery   Silverio LaySandra Kaidan Harpster MD 01/19/2015, 6:40 PM

## 2015-01-20 LAB — CBC
HEMATOCRIT: 22.4 % — AB (ref 36.0–46.0)
HEMATOCRIT: 27.4 % — AB (ref 36.0–46.0)
HEMOGLOBIN: 9.2 g/dL — AB (ref 12.0–15.0)
Hemoglobin: 7.5 g/dL — ABNORMAL LOW (ref 12.0–15.0)
MCH: 28.8 pg (ref 26.0–34.0)
MCH: 28.8 pg (ref 26.0–34.0)
MCHC: 33.5 g/dL (ref 30.0–36.0)
MCHC: 33.6 g/dL (ref 30.0–36.0)
MCV: 85.6 fL (ref 78.0–100.0)
MCV: 86.2 fL (ref 78.0–100.0)
PLATELETS: 246 10*3/uL (ref 150–400)
Platelets: 219 10*3/uL (ref 150–400)
RBC: 2.6 MIL/uL — ABNORMAL LOW (ref 3.87–5.11)
RBC: 3.2 MIL/uL — ABNORMAL LOW (ref 3.87–5.11)
RDW: 14.7 % (ref 11.5–15.5)
RDW: 15 % (ref 11.5–15.5)
WBC: 18.1 10*3/uL — ABNORMAL HIGH (ref 4.0–10.5)
WBC: 28.4 10*3/uL — ABNORMAL HIGH (ref 4.0–10.5)

## 2015-01-20 LAB — ABO/RH: ABO/RH(D): O POS

## 2015-01-20 LAB — RPR: RPR Ser Ql: NONREACTIVE

## 2015-01-20 MED ORDER — ONDANSETRON HCL 4 MG/2ML IJ SOLN
4.0000 mg | INTRAMUSCULAR | Status: DC | PRN
Start: 2015-01-20 — End: 2015-01-21

## 2015-01-20 MED ORDER — DIPHENHYDRAMINE HCL 25 MG PO CAPS: 25.0000 mg | ORAL_CAPSULE | Freq: Four times a day (QID) | ORAL | Status: DC | PRN

## 2015-01-20 MED ORDER — ONDANSETRON HCL 4 MG PO TABS: 4.0000 mg | ORAL_TABLET | ORAL | Status: DC | PRN

## 2015-01-20 MED ORDER — PRENATAL MULTIVITAMIN CH
1.0000 | ORAL_TABLET | Freq: Every day | ORAL | Status: DC
  Administered 2015-01-20: 1 via ORAL
  Filled 2015-01-20: qty 1

## 2015-01-20 MED ORDER — ACETAMINOPHEN 325 MG PO TABS: 650.0000 mg | ORAL_TABLET | ORAL | Status: DC | PRN

## 2015-01-20 MED ORDER — DIBUCAINE 1 % RE OINT: 1.0000 "application " | TOPICAL_OINTMENT | RECTAL | Status: DC | PRN

## 2015-01-20 MED ORDER — WITCH HAZEL-GLYCERIN EX PADS: 1.0000 "application " | MEDICATED_PAD | CUTANEOUS | Status: DC | PRN

## 2015-01-20 MED ORDER — BENZOCAINE-MENTHOL 20-0.5 % EX AERO
1.0000 "application " | INHALATION_SPRAY | CUTANEOUS | Status: DC | PRN
  Administered 2015-01-20: 1 via TOPICAL
  Filled 2015-01-20: qty 56

## 2015-01-20 MED ORDER — LANOLIN HYDROUS EX OINT: TOPICAL_OINTMENT | CUTANEOUS | Status: DC | PRN

## 2015-01-20 MED ORDER — SIMETHICONE 80 MG PO CHEW: 80.0000 mg | CHEWABLE_TABLET | ORAL | Status: DC | PRN

## 2015-01-20 MED ORDER — ZOLPIDEM TARTRATE 5 MG PO TABS: 5.0000 mg | ORAL_TABLET | Freq: Every evening | ORAL | Status: DC | PRN

## 2015-01-20 MED ORDER — TETANUS-DIPHTH-ACELL PERTUSSIS 5-2.5-18.5 LF-MCG/0.5 IM SUSP: 0.5000 mL | Freq: Once | INTRAMUSCULAR | Status: DC

## 2015-01-20 MED ORDER — SENNOSIDES-DOCUSATE SODIUM 8.6-50 MG PO TABS
2.0000 | ORAL_TABLET | ORAL | Status: DC
  Administered 2015-01-20 (×2): 2 via ORAL
  Filled 2015-01-20 (×2): qty 2

## 2015-01-20 MED ORDER — OXYCODONE-ACETAMINOPHEN 5-325 MG PO TABS: 2.0000 | ORAL_TABLET | ORAL | Status: DC | PRN

## 2015-01-20 MED ORDER — OXYCODONE-ACETAMINOPHEN 5-325 MG PO TABS
1.0000 | ORAL_TABLET | ORAL | Status: DC | PRN
  Administered 2015-01-20 – 2015-01-21 (×2): 1 via ORAL
  Filled 2015-01-20 (×2): qty 1

## 2015-01-20 NOTE — Lactation Note (Signed)
This note was copied from the chart of Tammy Merikay Gutierrez. Lactation Consultation Note  Patient Name: Tammy Gutierrez EAVWU'JToday's Date: 01/20/2015 Reason for consult: Initial assessment Baby is 3719 hours old and has been sleepy per mom , has been to the breast , voided and stooled in life. LC reviewed basics - and assisted with latch on both breast and worked on positioning and depth. Multiply swallows noted , increased with massage , and breast compressions. Per mom these 2 latches were definitely more comfortable compared to this morning.  LC recommended prior to the 1st breast - breast massage , hand express, breast compressions with latch , and wait  For the baby to open wide for latch.  Mother informed of post-discharge support and given phone number to the lactation department, including services for phone call assistance; out-patient appointments; and breastfeeding support group. List of other breastfeeding resources in the community given in the handout. Encouraged mother to call for problems or concerns related to breastfeeding.  Maternal Data Has patient been taught Hand Expression?: Yes Does the patient have breastfeeding experience prior to this delivery?: No  Feeding Feeding Type: Breast Fed Length of feed: 15 min  LATCH Score/Interventions Latch: Grasps breast easily, tongue down, lips flanged, rhythmical sucking. Intervention(s): Skin to skin;Waking techniques;Teach feeding cues Intervention(s): Adjust position;Assist with latch;Breast massage;Breast compression  Audible Swallowing: Spontaneous and intermittent  Type of Nipple: Everted at rest and after stimulation  Comfort (Breast/Nipple): Soft / non-tender  Problem noted: Mild/Moderate discomfort  Hold (Positioning): Assistance needed to correctly position infant at breast and maintain latch. Intervention(s): Breastfeeding basics reviewed;Support Pillows;Position options;Skin to skin  LATCH Score:  9  Lactation Tools Discussed/Used     Consult Status Consult Status: Follow-up Date: 01/21/15 Follow-up type: In-patient    Kathrin Greathouseorio, Jontay Maston Ann 01/20/2015, 5:16 PM

## 2015-01-20 NOTE — Progress Notes (Signed)
Tammy Gutierrez   Subjective: Post Partum Day 1 Vaginal delivery, 2 degree laceration Patient up ad lib, denies syncope or dizziness. Reports consuming regular diet without issues and denies N/V No issues with urination and reports bleeding is appropriate  Feeding:  breast Contraceptive plan:   micronor  Objective: Temp:  [98.4 F (36.9 C)-99.1 F (37.3 C)] 98.4 F (36.9 C) (07/20 0515) Pulse Rate:  [69-117] 86 (07/20 0515) Resp:  [18-22] 18 (07/20 0515) BP: (110-152)/(67-91) 123/67 mmHg (07/20 0515) SpO2:  [98 %] 98 % (07/19 2325) Weight:  [236 lb (107.049 kg)] 236 lb (107.049 kg) (07/19 1423)  Physical Exam:  General: alert and cooperative Ext: WNL, no significant  edema. No evidence of DVT seen on physical exam. Breast: Soft filling Lungs: CTAB Heart RRR without murmur  Abdomen:  Soft, fundus firm, lochia scant, + bowel sounds, non distended, non tender Lochia: appropriate Uterine Fundus: firm Laceration: healing well    Recent Labs  01/20/15 0031 01/20/15 0605  HGB 9.2* 7.5*  HCT 27.4* 22.4*    Assessment S/P Vaginal Delivery-Day 1 Stable  Normal Involution Breastfeeding Circumcision:  Plan: Continue current care Plan for discharge tomorrow and Breastfeeding Lactation support   Tammy Gutierrez, CNM, MSN 01/20/2015, 11:29 AM

## 2015-01-21 MED ORDER — FERROUS SULFATE 325 (65 FE) MG PO TABS: 325.0000 mg | ORAL_TABLET | Freq: Two times a day (BID) | ORAL | Status: AC

## 2015-01-21 MED ORDER — IBUPROFEN 600 MG PO TABS: 600.0000 mg | ORAL_TABLET | Freq: Four times a day (QID) | ORAL | Status: AC

## 2015-01-21 NOTE — Discharge Summary (Signed)
  Vaginal Delivery Discharge Summary  Tammy Gutierrez  DOB:    1991/02/03 MRN:    [id number] CSN:    [id number]  Date of admission:                  01/19/2015   Date of discharge:                   01/21/2015  Procedures this admission:   SVD with 2nd Degree Laceration  Date of Delivery: 01/19/2015  Newborn Data:  Live born female  Birth Weight: 7 lb 1.6 oz (3220 g) APGAR: 8, 9  Home with mother. Name: Tammy Gutierrez Circumcision Plan: None  History of Present Illness:  Ms. Tammy Gutierrez is a 24 y.o. female, G1P1001, who presents at [redacted]w[redacted]d weeks gestation. The patient has been followed at [facility]. She was admitted onset of labor. Her pregnancy has been complicated by:  Patient Active Problem List   Diagnosis Date Noted  . Vaginal delivery 01/19/2015  . Second-degree perineal laceration, with delivery 01/19/2015     Hospital Course:  Admitted 01/19/2015 with contractions and SROM shortly after admission. Positive GBS. Progressed  Without augmentation. Utilized coping mechanisms for pain management.  Delivery was performed by K. Mayford Knife, CNM without complication. Patient and baby tolerated the procedure without difficulty, with  2nd degree laceration noted. Infant status was stable and remained in room with mother.  Mother and infant then had an uncomplicated postpartum course, with breast feeding going well. However, she was started on iron supplementation for asymptomatic anemia. Mom's physical exam was WNL, and she was discharged home in stable condition. Contraception plan was Pharmacist, hospital.  She received adequate benefit from po pain medications.    Feeding:  breast  Contraception:  oral contraceptives (estrogen/progesterone)  Hemoglobin Results:  CBC Latest Ref Rng 01/20/2015 01/20/2015 01/19/2015  WBC 4.0 - 10.5 K/uL 18.1(H) 28.4(H) 14.7(H)  Hemoglobin 12.0 - 15.0 g/dL 7.5(L) 9.2(L) 10.7(L)   Hematocrit 36.0 - 46.0 % 22.4(L) 27.4(L) 32.5(L)  Platelets 150 - 400 K/uL 219 246 238     Discharge Physical Exam:   General: cooperative, fatigued and no distress Mood/Affect: Appropriate Chest: clear to auscultation, no wheezes, rales or rhonchi, symmetric air entry.  CVS exam: normal rate and regular rhythm. Breast exam: breasts appear normal, no suspicious masses, no skin or nipple changes or axillary nodes. Abdomen:  + bowel sounds, Soft, NT Uterine Fundus: firm, U/-2 Lochia: appropriate Laceration: Not Assessed DVT Evaluation: No evidence of DVT seen on physical exam. No cords or calf tenderness. Ted hoses on Skin exam: Warm, Dry.  Procedures &/or Complications: Intrapartum Procedures: spontaneous vaginal delivery and GBS prophylaxis Postpartum Procedures: none Complications-Operative and Postpartum: none   Discharge Information:  Diagnoses: Term Pregnancy-delivered Activity:  pelvic rest Diet:   routine Medications: PNV, Ibuprofen and Iron Condition: stable Instructions:  PPD, Iron Rich Diet Discharge to: home  Follow-up Information    Follow up with [facility] In 6 weeks.   Specialty:  Obstetrics and Gynecology   Why:  Please call if you have any questions or concerns prior to your next visit.   Contact information:   [address] [place] [address] [phone]       Marlene Bast MSN, CNM 01/21/2015 6:40 AM

## 2015-01-21 NOTE — Lactation Note (Signed)
This note was copied from the chart of Tammy Gutierrez. Lactation Consultation Note  Patient Name: Tammy Gutierrez [id number] Date: 01/21/2015 Reason for consult: Follow-up assessment Baby 35 hours of life. Mom reports that her nipples are somewhat sore. Mom was already nursing when this [facility] entered room. Demonstrated to mom how to achieve a deeper latch in football position. Mom also given comfort gels with instructions. Referred mom to Baby and Me booklet for number of diapers to expect by day of life and EBM storage guidelines. Discussed nursing/pumping and returning to school. Mom aware of OP/BFSG and LC phone line assistance after D/C.  Maternal Data    Feeding    LATCH Score/Interventions Latch: Grasps breast easily, tongue down, lips flanged, rhythmical sucking. Intervention(s): Adjust position  Audible Swallowing: Spontaneous and intermittent  Type of Nipple: Everted at rest and after stimulation     Problem noted: Mild/Moderate discomfort  Hold (Positioning): Assistance needed to correctly position infant at breast and maintain latch. Intervention(s): Support Pillows     Lactation Tools Discussed/Used WIC Program: No Pump Review: Setup, frequency, and cleaning;Milk Storage Initiated by:: caitlin field  Date initiated:: 01/21/15   Consult Status Consult Status: PRN    Geralynn Ochs 01/21/2015, 8:46 AM

## 2015-01-21 NOTE — Progress Notes (Addendum)
EBL 540 ml -- met criteria for Stage 1 PPH. Pt. Asymptomatic.  Hbg:  10.7 on admission  9.2 at 00:31  7.5 at 06:05  Pulse range 101-117 --- will monitor closely  Farrel Gordon, CNM 01/20/15, 06:50 AM

## 2015-01-21 NOTE — Discharge Instructions (Signed)
Iron-Rich Diet °An iron-rich diet contains foods that are good sources of iron. Iron is an important mineral that helps your body produce hemoglobin. Hemoglobin is a protein in red blood cells that carries oxygen to the body's tissues. Sometimes, the iron level in your blood can be low. This may be caused by: °· A lack of iron in your diet. °· Blood loss. °· Times of growth, such as during pregnancy or during a child's growth and development. °Low levels of iron can cause a decrease in the number of red blood cells. This can result in iron deficiency anemia. Iron deficiency anemia symptoms include: °· Tiredness. °· Weakness. °· Irritability. °· Increased chance of infection. °Here are some recommendations for daily iron intake: °· Males older than 25 years of age need 8 mg of iron per day. °· Women ages 19 to 50 need 18 mg of iron per day. °· Pregnant women need 27 mg of iron per day, and women who are over 19 years of age and breastfeeding need 9 mg of iron per day. °· Women over the age of 50 need 8 mg of iron per day. °SOURCES OF IRON °There are 2 types of iron that are found in food: heme iron and nonheme iron. Heme iron is absorbed by the body better than nonheme iron. Heme iron is found in meat, poultry, and fish. Nonheme iron is found in grains, beans, and vegetables. °Heme Iron Sources °Food / Iron (mg) °· Chicken liver, 3 oz (85 g)/ 10 mg °· Beef liver, 3 oz (85 g)/ 5.5 mg °· Oysters, 3 oz (85 g)/ 8 mg °· Beef, 3 oz (85 g)/ 2 to 3 mg °· Shrimp, 3 oz (85 g)/ 2.8 mg °· Turkey, 3 oz (85 g)/ 2 mg °· Chicken, 3 oz (85 g) / 1 mg °· Fish (tuna, halibut), 3 oz (85 g)/ 1 mg °· Pork, 3 oz (85 g)/ 0.9 mg °Nonheme Iron Sources °Food / Iron (mg) °· Ready-to-eat breakfast cereal, iron-fortified / 3.9 to 7 mg °· Tofu, ½ cup / 3.4 mg °· Kidney beans, ½ cup / 2.6 mg °· Baked potato with skin / 2.7 mg °· Asparagus, ½ cup / 2.2 mg °· Avocado / 2 mg °· Dried peaches, ½ cup / 1.6 mg °· Raisins, ½ cup / 1.5 mg °· Soy milk, 1 cup  / 1.5 mg °· Whole-wheat bread, 1 slice / 1.2 mg °· Spinach, 1 cup / 0.8 mg °· Broccoli, ½ cup / 0.6 mg °IRON ABSORPTION °Certain foods can decrease the body's absorption of iron. Try to avoid these foods and beverages while eating meals with iron-containing foods: °· Coffee. °· Tea. °· Fiber. °· Soy. °Foods containing vitamin C can help increase the amount of iron your body absorbs from iron sources, especially from nonheme sources. Eat foods with vitamin C along with iron-containing foods to increase your iron absorption. Foods that are high in vitamin C include many fruits and vegetables. Some good sources are: °· Fresh orange juice. °· Oranges. °· Strawberries. °· Mangoes. °· Grapefruit. °· Red bell peppers. °· Green bell peppers. °· Broccoli. °· Potatoes with skin. °· Tomato juice. °Document Released: 01/31/2005 Document Revised: 09/11/2011 Document Reviewed: 12/08/2010 °ExitCare® Patient Information ©2015 [employer]. This information is not intended to replace advice given to you by your health care provider. Make sure you discuss any questions you have with your health care provider. °Postpartum Depression and Baby Blues °The postpartum period begins right after the birth of a baby.   During this time, there is often a great amount of joy and excitement. It is also a time of many changes in the life of the parents. Regardless of how many times a mother gives birth, each child brings new challenges and dynamics to the family. It is not unusual to have feelings of excitement along with confusing shifts in moods, emotions, and thoughts. All mothers are at risk of developing postpartum depression or the "baby blues." These mood changes can occur right after giving birth, or they may occur many months after giving birth. The baby blues or postpartum depression can be mild or severe. Additionally, postpartum depression can go away rather quickly, or it can be a long-term condition.  °CAUSES °Raised hormone levels  and the rapid drop in those levels are thought to be a main cause of postpartum depression and the baby blues. A number of hormones change during and after pregnancy. Estrogen and progesterone usually decrease right after the delivery of your baby. The levels of thyroid hormone and various cortisol steroids also rapidly drop. Other factors that play a role in these mood changes include major life events and genetics.  °RISK FACTORS °If you have any of the following risks for the baby blues or postpartum depression, know what symptoms to watch out for during the postpartum period. Risk factors that may increase the likelihood of getting the baby blues or postpartum depression include: °· Having a personal or family history of depression.   °· Having depression while being pregnant.   °· Having premenstrual mood issues or mood issues related to oral contraceptives. °· Having a lot of life stress.   °· Having marital conflict.   °· Lacking a social support network.   °· Having a baby with special needs.   °· Having health problems, such as diabetes.   °SIGNS AND SYMPTOMS °Symptoms of baby blues include: °· Brief changes in mood, such as going from extreme happiness to sadness. °· Decreased concentration.   °· Difficulty sleeping.   °· Crying spells, tearfulness.   °· Irritability.   °· Anxiety.   °Symptoms of postpartum depression typically begin within the first month after giving birth. These symptoms include: °· Difficulty sleeping or excessive sleepiness.   °· Marked weight loss.   °· Agitation.   °· Feelings of worthlessness.   °· Lack of interest in activity or food.   °Postpartum psychosis is a very serious condition and can be dangerous. Fortunately, it is rare. Displaying any of the following symptoms is cause for immediate medical attention. Symptoms of postpartum psychosis include:  °· Hallucinations and delusions.   °· Bizarre or disorganized behavior.   °· Confusion or disorientation.   °DIAGNOSIS  °A  diagnosis is made by an evaluation of your symptoms. There are no medical or lab tests that lead to a diagnosis, but there are various questionnaires that a health care provider may use to identify those with the baby blues, postpartum depression, or psychosis. Often, a screening tool called the Edinburgh Postnatal Depression Scale is used to diagnose depression in the postpartum period.  °TREATMENT °The baby blues usually goes away on its own in 1-2 weeks. Social support is often all that is needed. You will be encouraged to get adequate sleep and rest. Occasionally, you may be given medicines to help you sleep.  °Postpartum depression requires treatment because it can last several months or longer if it is not treated. Treatment may include individual or group therapy, medicine, or both to address any social, physiological, and psychological factors that may play a role in the depression. Regular exercise, a healthy diet, rest,   and social support may also be strongly recommended.  °Postpartum psychosis is more serious and needs treatment right away. Hospitalization is often needed. °HOME CARE INSTRUCTIONS °· Get as much rest as you can. Nap when the baby sleeps.   °· Exercise regularly. Some women find yoga and walking to be beneficial.   °· Eat a balanced and nourishing diet.   °· Do little things that you enjoy. Have a cup of tea, take a bubble bath, read your favorite magazine, or listen to your favorite music. °· Avoid alcohol.   °· Ask for help with household chores, cooking, grocery shopping, or running errands as needed. Do not try to do everything.   °· Talk to people close to you about how you are feeling. Get support from your partner, family members, friends, or other new moms. °· Try to stay positive in how you think. Think about the things you are grateful for.   °· Do not spend a lot of time alone.   °· Only take over-the-counter or prescription medicine as directed by your health care  provider. °· Keep all your postpartum appointments.   °· Let your health care provider know if you have any concerns.   °SEEK MEDICAL CARE IF: °You are having a reaction to or problems with your medicine. °SEEK IMMEDIATE MEDICAL CARE IF: °· You have suicidal feelings.   °· You think you may harm the baby or someone else. °MAKE SURE YOU: °· Understand these instructions. °· Will watch your condition. °· Will get help right away if you are not doing well or get worse. °Document Released: 03/23/2004 Document Revised: 06/24/2013 Document Reviewed: 03/31/2013 °ExitCare® Patient Information ©2015 [employer]. This information is not intended to replace advice given to you by your health care provider. Make sure you discuss any questions you have with your health care provider. ° °

## 2016-09-16 ENCOUNTER — Inpatient Hospital Stay (HOSPITAL_COMMUNITY)
Admission: AD | Admit: 2016-09-16 | Discharge: 2016-09-16 | Disposition: A | Discharge status: Home / Self Care | Payer: Medicaid Other | Source: Ambulatory Visit | Attending: Obstetrics & Gynecology | Admitting: Obstetrics & Gynecology

## 2016-09-16 ENCOUNTER — Encounter (HOSPITAL_COMMUNITY): Payer: Self-pay

## 2016-09-16 DIAGNOSIS — R200 Anesthesia of skin: Secondary | ICD-10-CM | POA: Diagnosis not present

## 2016-09-16 DIAGNOSIS — R1030 Lower abdominal pain, unspecified: Secondary | ICD-10-CM | POA: Insufficient documentation

## 2016-09-16 DIAGNOSIS — N898 Other specified noninflammatory disorders of vagina: Secondary | ICD-10-CM

## 2016-09-16 DIAGNOSIS — G5601 Carpal tunnel syndrome, right upper limb: Secondary | ICD-10-CM

## 2016-09-16 LAB — POCT PREGNANCY, URINE: Preg Test, Ur: NEGATIVE

## 2016-09-16 LAB — URINALYSIS, ROUTINE W REFLEX MICROSCOPIC
Bilirubin Urine: NEGATIVE
Glucose, UA: NEGATIVE mg/dL
Hgb urine dipstick: NEGATIVE
Ketones, ur: NEGATIVE mg/dL
Nitrite: NEGATIVE
Protein, ur: NEGATIVE mg/dL
SPECIFIC GRAVITY, URINE: 1.026 (ref 1.005–1.030)
pH: 6 (ref 5.0–8.0)

## 2016-09-16 LAB — WET PREP, GENITAL
Clue Cells Wet Prep HPF POC: NONE SEEN
SPERM: NONE SEEN
Trich, Wet Prep: NONE SEEN
Yeast Wet Prep HPF POC: NONE SEEN

## 2016-09-16 NOTE — MAU Note (Signed)
Patient presents with lower back pain and right hand pain and numbness on right hand, currently has pain on left lower quadrant, having dizziness.

## 2016-09-16 NOTE — MAU Provider Note (Signed)
History   G1p1001 not pregnant in with several complaints. Numbness of right hand when working. And very low abd pain in inguinal area x 2 wks. Pain is nagging in nature. Also c/o vaginal discharge, white in color.no odor, no itching or burning.  CSN: [id number]  Arrival date & time 09/16/16  1729   None     Chief Complaint  Patient presents with  . Back Pain  . Numbness    fingers    HPI  Past Medical History:  Diagnosis Date  . Anemia   . Hx of ovarian cystectomy 10/17/2013   Paratubal cyst  . Ovarian cyst   . Wisdom teeth removed 2014    Past Surgical History:  Procedure Laterality Date  . ROBOTIC ASSISTED LAPAROSCOPIC OVARIAN CYSTECTOMY N/A 10/17/2013   Procedure: ROBOTIC ASSISTED LAPAROSCOPIC PARATUBAL CYSTECTOMY;  Surgeon: Antionette CharLisa Jackson-Moore, MD;  Location: [facility];  Service: Gynecology;  Laterality: N/A;  . WISDOM TOOTH EXTRACTION      Family History  Problem Relation Age of Onset  . Depression Mother   . Cancer Neg Hx   . Heart disease Neg Hx   . Diabetes Neg Hx   . Hypertension Neg Hx     Social History  Substance Use Topics  . Smoking status: Never Smoker  . Smokeless tobacco: Never Used  . Alcohol use No    OB History    Gravida Para Term Preterm AB Living   1 1 1  0 0 1   SAB TAB Ectopic Multiple Live Births   0 0 0 0 1      Review of Systems  Constitutional: Negative.   HENT: Negative.   Eyes: Negative.   Respiratory: Negative.   Cardiovascular: Negative.   Gastrointestinal:       Pain in left ingunal area.  Endocrine: Negative.   Genitourinary: Positive for vaginal discharge.  Musculoskeletal: Negative.   Skin: Negative.     Allergies  Patient has no known allergies.  Home Medications    BP 113/62   Pulse 78   Resp 16   Ht 5\' 5"  (1.651 m)   Wt 176 lb 1.9 oz (79.9 kg)   LMP 08/12/2016   SpO2 100%   BMI 29.31 kg/m   Physical Exam  Constitutional: She is oriented to person, place, and time. She appears well-developed and  well-nourished.  HENT:  Head: Normocephalic.  Eyes: Pupils are equal, round, and reactive to light.  Neck: Normal range of motion.  Cardiovascular: Normal rate, regular rhythm, normal heart sounds and intact distal pulses.   Pulmonary/Chest: Effort normal and breath sounds normal.  Abdominal: Soft. Bowel sounds are normal.  Genitourinary: Vagina normal and uterus normal.  Musculoskeletal: Normal range of motion.  Neurological: She is alert and oriented to person, place, and time. She has normal reflexes.  Skin: Skin is warm and dry.  Psychiatric: She has a normal mood and affect. Her behavior is normal. Judgment and thought content normal.    MAU Course  Procedures (including critical care time)  Labs Reviewed  URINALYSIS, ROUTINE W REFLEX MICROSCOPIC  POCT PREGNANCY, URINE   No results found.   No diagnosis found.    MDM  Wet prep , GC and chla obtained. VSS, grip equil bilaterially no abd tenderness. No hernia noted with exam. UPT neg. Recommended pt see primary care for evaluation of her hand and pain in inguinal area. D/c home

## 2016-09-16 NOTE — Discharge Instructions (Signed)
Carpal Tunnel Syndrome Carpal tunnel syndrome is a condition that causes pain in your hand and arm. The carpal tunnel is a narrow area that is on the palm side of your wrist. Repeated wrist motion or certain diseases may cause swelling in the tunnel. This swelling can pinch the main nerve in the wrist (median nerve). Follow these instructions at home: If you have a splint:   Wear it as told by your doctor. Remove it only as told by your doctor.  Loosen the splint if your fingers:  Become numb and tingle.  Turn blue and cold.  Keep the splint clean and dry. General instructions   Take over-the-counter and prescription medicines only as told by your doctor.  Rest your wrist from any activity that may be causing your pain. If needed, talk to your employer about changes that can be made in your work, such as getting a wrist pad to use while typing.  If directed, apply ice to the painful area:  Put ice in a plastic bag.  Place a towel between your skin and the bag.  Leave the ice on for 20 minutes, 2-3 times per day.  Keep all follow-up visits as told by your doctor. This is important.  Do any exercises as told by your doctor, physical therapist, or occupational therapist. Contact a doctor if:  You have new symptoms.  Medicine does not help your pain.  Your symptoms get worse. This information is not intended to replace advice given to you by your health care provider. Make sure you discuss any questions you have with your health care provider. Document Released: 06/08/2011 Document Revised: 11/25/2015 Document Reviewed: 11/04/2014 Elsevier Interactive Patient Education  2017 Elsevier Inc.  

## 2016-09-18 LAB — GC/CHLAMYDIA PROBE AMP (~~LOC~~) NOT AT ARMC
Chlamydia: NEGATIVE
NEISSERIA GONORRHEA: NEGATIVE

## 2016-10-06 ENCOUNTER — Ambulatory Visit (INDEPENDENT_AMBULATORY_CARE_PROVIDER_SITE_OTHER): Payer: Medicaid Other | Admitting: Family Medicine

## 2016-10-06 VITALS — BP 110/58 | HR 78 | Temp 98.2°F | Ht 65.0 in | Wt 176.6 lb

## 2016-10-06 DIAGNOSIS — D649 Anemia, unspecified: Secondary | ICD-10-CM | POA: Diagnosis not present

## 2016-10-06 DIAGNOSIS — Z0000 Encounter for general adult medical examination without abnormal findings: Secondary | ICD-10-CM | POA: Diagnosis present

## 2016-10-06 NOTE — Progress Notes (Signed)
   Subjective:   Patient ID: Tammy Gutierrez    DOB: 01-Jul-1991, 26 y.o. female   MRN: [id number]  CC: new patient   HPI: Tammy Gutierrez is a 26 y.o. female who presents to clinic today to establish care with new doctor.  She is present today with her daughter Tammy Gutierrez.  Has lived in the area for ~15 years.  Did not have a PCP prior to this.  No concerns today, she is here for a regular check-up.    PMH: R paratubal cyst, surgically removed on 10/2013 at [facility] with robotic-assist.  States her hemoglobin has always been a little low.  Feels tired sometimes but is not sure if this is the reason.  BL appears to be about 11.  She was supposed to be taking iron for this but has not been because she is not in the habit of it and forgets.   Surg Hx: wisdom teeth removal, cyst surgery.  Social Hx : lives with mom and daughter (born NSVD with no complications).  Never smoker, social etoh, no drug use. Used to work in [employer], stopped about 2 months ago because of hand pain and lower back pain.   Fhx: mom -depression, dad's side has diabetes.   Meds: None, tries to take a MV daily.   ROS: Denies fevers, chills, nausea, vomiting, diarrhea, abdominal pain. Denies shortness of breath, chest pain.  Denies leg swelling.   Medications reviewed.  Objective:   BP (!) 110/58   Pulse 78   Temp 98.2 F (36.8 C) (Oral)   Ht  (1.651 m)   Wt 176 lb 9.6 oz (80.1 kg)   LMP 09/19/2016 (Approximate)   SpO2 97%   BMI 29.39 kg/m  Vitals and nursing note reviewed.  General: 26 yo F, well nourished, well developed, in no acute distress HEENT: normocephalic, atraumatic, moist mucous membranes, no conjunctival pallor  Neck: supple, non-tender without lymphadenopathy CV: regular rate and rhythm without murmurs rubs or gallops Lungs: clear to auscultation bilaterally with normal work of breathing Abdomen: soft, non-tender, no masses or organomegaly palpable, normoactive bowel sounds Skin:  warm, dry, no rashes or lesions, cap refill < 2 seconds Extremities: warm and well perfused, normal tone  Assessment & Plan:   Healthy 26 yo F presents to clinic to establish care with PCP.  With no concerns at this time.    History of ?iron deficiency anemia.  Baseline appears to be ~11.  Does not feel -Will check CBC today.   Health Maintenance:  -Used to have an OB at [facility].  History of normal paps.  Last one was 1 month - also normal.  -Has not had flu shot.  Has always declined it in the past.   -HIV screen completed on 08/2014 - negative.    Orders Placed This Encounter  Procedures  . CBC    Freddrick March, MD [facility], PGY-1 10/11/2016 12:19 AM

## 2016-10-06 NOTE — Patient Instructions (Signed)
It was nice meeting you! You were seen in clinic today to establish care with me as your new PCP.  We discussed your past medical history, medications and surgeries.  We also addressed some important health maintenance issues to make sure you are up to date.  I have ordered some labs today for your history of anemia and will follow up with you with the results. Please make an appointment to be seen in clinic next week to address your other concerns.   Be well,  Freddrick March, MD

## 2016-10-07 LAB — CBC
Hematocrit: 34 % (ref 34.0–46.6)
Hemoglobin: 11 g/dL — ABNORMAL LOW (ref 11.1–15.9)
MCH: 27.1 pg (ref 26.6–33.0)
MCHC: 32.4 g/dL (ref 31.5–35.7)
MCV: 84 fL (ref 79–97)
Platelets: 284 10*3/uL (ref 150–379)
RBC: 4.06 x10E6/uL (ref 3.77–5.28)
RDW: 14.1 % (ref 12.3–15.4)
WBC: 7.3 10*3/uL (ref 3.4–10.8)

## 2016-10-13 ENCOUNTER — Ambulatory Visit (INDEPENDENT_AMBULATORY_CARE_PROVIDER_SITE_OTHER): Payer: Medicaid Other | Admitting: Family Medicine

## 2016-10-13 ENCOUNTER — Encounter: Payer: Self-pay | Admitting: Family Medicine

## 2016-10-13 VITALS — BP 100/60 | HR 74 | Temp 98.4°F | Ht 65.0 in | Wt 176.0 lb

## 2016-10-13 DIAGNOSIS — G5601 Carpal tunnel syndrome, right upper limb: Secondary | ICD-10-CM

## 2016-10-13 DIAGNOSIS — R1032 Left lower quadrant pain: Secondary | ICD-10-CM | POA: Diagnosis not present

## 2016-10-13 MED ORDER — WRIST SPLINT/COCK-UP/RIGHT M MISC: 1.0000 | Freq: Every day | 0 refills | Status: AC

## 2016-10-13 NOTE — Patient Instructions (Addendum)
It was nice seeing you today! You were seen in clinic for R wrist pain.  Your signs and symptoms are likely due to Carpal Tunnel Syndrome and as we discussed, this can be alleviated by use of a wrist splint at nighttime.  I have ordered one for you to try.  If this does not help after a month or so, we can consider a steroid injection.  I have also ordered some bloodwork today to evaluate for other conditions that can be causing you to have carpal tunnel.   For your groin pain which may be related to a hernia, I have ordered an ultrasound.  You will be scheduled for this.    Be well,  Freddrick March, MD

## 2016-10-13 NOTE — Progress Notes (Signed)
Subjective:   Patient ID: Tammy Gutierrez    DOB: 01-05-1991, 26 y.o. female   MRN: [id number]  CC:  R hand pain and LLQ pain   HPI: Tammy Gutierrez is a 26 y.o. female who presents to clinic today for follow up on additional issues: R hand pain and LLQ pain.     LLQ pain  Told by OB it could possibly be a hernia.  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  First noticed the pain 2-3 weeks before she quit her job around [employer].   Has never had any imaging done on her abdomen.  Pain is not constant, she sometimes has sharp pain on L side that feels like a pinching sensation out of nowhere.  Goes away on its own.  Has not taken anything at home for the pain.    Pain in R wrist Started when working in [employer] by [employer].  She noticed that it worsened during her pregnancy because she was swollen.  After delivery, hand pain went away.  Started again around beginning of March once she started working.  She quit her job due to pain and not being able to lift heavy boxes.      Reports numbness and tingling at night sometimes.    Especially in radial side of palm.  Denies weakness.  Some tenderness when she presses on it. Has to stretch her arms out to get comfortable.  Has not taken any medications for this.   Was told she should try to wear a wrist splint when she was seen by [facility] during her pregnancy however she has not tried this yet.    ROS: Denies fevers, chills, nausea, vomiting.   Smoking status reviewed.  Patient is a never smoker.   Medications reviewed.  Objective:   BP 100/60   Pulse 74   Temp 98.4 F (36.9 C) (Oral)   Ht  (1.651 m)   Wt 176 lb (79.8 kg)   LMP 09/19/2016   SpO2 99%   BMI 29.29 kg/m  Vitals and nursing note reviewed.  General: 26 yo F, in NAD  HEENT: NCAT, MMM, o/p clear Neck: supple CV: RRR no MRG  Lungs: CTA B/L Abdomen: soft, mild TTP to left inguinal area,  non-distended, no evidence of bulging when lying flat or standing, no masses palpable, no rebound or guarding on exam, +bs Skin: warm, dry Extremities: R wrist with no TTP, +Phalen's test Neuro: AOx3, grip strength 5/5 bilaterally, no focal deficits  Assessment & Plan:   Carpal tunnel syndrome Positive Phalen's test on exam after 30-40 seconds of median nerve compression.  Symptoms of numbness and tingling at nighttime.   No tenderness to palpation.   -Ordered nocturnal cock-up R wrist splint and advised patient to use this at bedtime for the next several weeks.   -Patient with history of worsening symptoms during her pregnancy which makes CTS even more likely.  Given CTS is associated with obesity, hypothyroidism, connective tissue disorders, RA and DM, will check labs today -RF, A1c and TSH ordered -If no improvement over next several weeks can consider glucocorticoid injection  Left groin pain Patient with left groin pain.  Was told by OB that may have a hernia.  No prior imaging done.  No evidence of bulging on exam and no concerns for strangulation/incarceration.  Patient has history of lifting heavy boxes for work and no longer does that.   Consider  left inguinal vs femoral hernia given location and symptoms as these are not always externally visible on women. Precepted with Dr. Lum Babe and agree that ultrasound may help to evaluate -Ordered pelvic ultrasound   Health Maintenance Patient reports normal Pap results last month at [facility].  Release of information signed today to obtain records and update HM.   Orders Placed This Encounter  Procedures  . US Pelvis Limited    Standing Status:   Future    Standing Expiration Date:   12/13/2017    Order Specific Question:   Reason for Exam (SYMPTOM  OR DIAGNOSIS REQUIRED)    Answer:   pelvic pain. Possible femoral vs inguinal hernia.  No bulging on exam    Order Specific Question:   Preferred imaging location?    Answer:   [facility]  . US Transvaginal Non-OB    Standing Status:   Future    Standing Expiration Date:   12/13/2017    Order Specific Question:   Reason for Exam (SYMPTOM  OR DIAGNOSIS REQUIRED)    Answer:   pelvic pain. Possible femoral vs inguinal hernia.  No bulging on exam    Order Specific Question:   Preferred imaging location?    Answer:   [facility]  . Rheumatoid factor  . TSH  . POCT glycosylated hemoglobin (Hb A1C)   Meds ordered this encounter  Medications  . Elastic Bandages & Supports (WRIST SPLINT/COCK-UP/RIGHT M) MISC    Sig: 1 Device by Does not apply route at bedtime.    Dispense:  1 each    Refill:  0   Follow up: 1 mo  Freddrick March, MD [facility], PGY-1 10/16/2016 5:43 PM

## 2016-10-14 LAB — RHEUMATOID FACTOR

## 2016-10-14 LAB — TSH: TSH: 1.16 u[IU]/mL (ref 0.450–4.500)

## 2016-10-16 ENCOUNTER — Encounter: Payer: Self-pay | Admitting: Family Medicine

## 2016-10-16 DIAGNOSIS — R1032 Left lower quadrant pain: Secondary | ICD-10-CM | POA: Insufficient documentation

## 2016-10-16 DIAGNOSIS — G5600 Carpal tunnel syndrome, unspecified upper limb: Secondary | ICD-10-CM | POA: Insufficient documentation

## 2016-10-16 NOTE — Assessment & Plan Note (Signed)
Positive Phalen's test on exam after 30-40 seconds of median nerve compression.  Symptoms of numbness and tingling at nighttime.   No tenderness to palpation.   -Ordered nocturnal cock-up R wrist splint and advised patient to use this at bedtime for the next several weeks.   -Patient with history of worsening symptoms during her pregnancy which makes CTS even more likely.  Given CTS is associated with obesity, hypothyroidism, connective tissue disorders, RA and DM, will check labs today -RF, A1c and TSH ordered -If no improvement over next several weeks can consider glucocorticoid injection

## 2016-10-16 NOTE — Assessment & Plan Note (Addendum)
Patient with left groin pain.  Was told by OB that may have a hernia.  No prior imaging done.  No evidence of bulging on exam and no concerns for strangulation/incarceration.  Patient has history of lifting heavy boxes for work and no longer does that.   Consider left inguinal vs femoral hernia given location and symptoms as these are not always externally visible on women. Precepted with Dr. Lum Babe and agree that ultrasound may help to evaluate -Ordered pelvic ultrasound

## 2016-10-20 ENCOUNTER — Ambulatory Visit (HOSPITAL_COMMUNITY): Payer: Medicaid Other

## 2016-10-25 ENCOUNTER — Ambulatory Visit (HOSPITAL_COMMUNITY)
Admission: RE | Admit: 2016-10-25 | Discharge: 2016-10-25 | Disposition: A | Discharge status: Home / Self Care | Payer: Medicaid Other | Source: Ambulatory Visit | Attending: Family Medicine | Admitting: Family Medicine

## 2016-10-25 DIAGNOSIS — R1032 Left lower quadrant pain: Secondary | ICD-10-CM | POA: Insufficient documentation

## 2016-11-07 ENCOUNTER — Telehealth: Payer: Self-pay | Admitting: Family Medicine

## 2016-11-07 NOTE — Telephone Encounter (Signed)
Pt would like someone to call her with u/s results. ep °

## 2016-11-07 NOTE — Telephone Encounter (Signed)
Please inform patient that her ultrasound did not show any masses or hernias.  If she continues to have worsening pain, have her schedule an appointment to come in and be seen.

## 2016-11-08 NOTE — Telephone Encounter (Signed)
Pt was advised. ep °

## 2016-11-08 NOTE — Telephone Encounter (Signed)
Attempted to call pt, no voicemail set up. Will try again later. Deseree Blount, CMA  

## 2017-12-20 ENCOUNTER — Emergency Department (HOSPITAL_COMMUNITY)
Admission: EM | Admit: 2017-12-20 | Discharge: 2017-12-21 | Disposition: A | Discharge status: Home / Self Care | Payer: Medicaid Other | Attending: Emergency Medicine | Admitting: Emergency Medicine

## 2017-12-20 ENCOUNTER — Encounter (HOSPITAL_COMMUNITY): Payer: Self-pay

## 2017-12-20 ENCOUNTER — Other Ambulatory Visit: Payer: Self-pay

## 2017-12-20 DIAGNOSIS — Z79899 Other long term (current) drug therapy: Secondary | ICD-10-CM | POA: Insufficient documentation

## 2017-12-20 DIAGNOSIS — R1013 Epigastric pain: Secondary | ICD-10-CM | POA: Diagnosis not present

## 2017-12-20 DIAGNOSIS — R0602 Shortness of breath: Secondary | ICD-10-CM | POA: Insufficient documentation

## 2017-12-20 DIAGNOSIS — K2900 Acute gastritis without bleeding: Secondary | ICD-10-CM | POA: Diagnosis not present

## 2017-12-20 DIAGNOSIS — R1011 Right upper quadrant pain: Secondary | ICD-10-CM | POA: Insufficient documentation

## 2017-12-20 DIAGNOSIS — R42 Dizziness and giddiness: Secondary | ICD-10-CM | POA: Insufficient documentation

## 2017-12-20 DIAGNOSIS — R110 Nausea: Secondary | ICD-10-CM | POA: Diagnosis not present

## 2017-12-20 DIAGNOSIS — R202 Paresthesia of skin: Secondary | ICD-10-CM | POA: Diagnosis not present

## 2017-12-20 DIAGNOSIS — R079 Chest pain, unspecified: Secondary | ICD-10-CM | POA: Diagnosis present

## 2017-12-20 NOTE — ED Triage Notes (Signed)
Pt states that she has been having CP and abd pain for the past two days, denies n/v/d, some SOB, states fevers at home. Denies cough

## 2017-12-21 ENCOUNTER — Emergency Department (HOSPITAL_COMMUNITY): Payer: Medicaid Other

## 2017-12-21 ENCOUNTER — Other Ambulatory Visit: Payer: Self-pay

## 2017-12-21 LAB — HEPATIC FUNCTION PANEL
ALT: 14 U/L (ref 14–54)
AST: 19 U/L (ref 15–41)
Albumin: 3.9 g/dL (ref 3.5–5.0)
Alkaline Phosphatase: 69 U/L (ref 38–126)
BILIRUBIN TOTAL: 0.3 mg/dL (ref 0.3–1.2)
Total Protein: 7.6 g/dL (ref 6.5–8.1)

## 2017-12-21 LAB — I-STAT BETA HCG BLOOD, ED (MC, WL, AP ONLY): I-stat hCG, quantitative: <5 m[IU]/mL (ref <5)

## 2017-12-21 LAB — LIPASE, BLOOD: Lipase: 29 U/L (ref 11–51)

## 2017-12-21 LAB — CBC
HCT: 32.7 % — ABNORMAL LOW (ref 36.0–46.0)
Hemoglobin: 10.3 g/dL — ABNORMAL LOW (ref 12.0–15.0)
MCH: 26.2 pg (ref 26.0–34.0)
MCHC: 31.5 g/dL (ref 30.0–36.0)
MCV: 83.2 fL (ref 78.0–100.0)
Platelets: 238 10*3/uL (ref 150–400)
RBC: 3.93 MIL/uL (ref 3.87–5.11)
RDW: 13 % (ref 11.5–15.5)
WBC: 6.8 10*3/uL (ref 4.0–10.5)

## 2017-12-21 LAB — BASIC METABOLIC PANEL
Anion gap: 7 (ref 5–15)
BUN: 7 mg/dL (ref 6–20)
CHLORIDE: 105 mmol/L (ref 101–111)
CO2: 26 mmol/L (ref 22–32)
Calcium: 9.1 mg/dL (ref 8.9–10.3)
Creatinine, Ser: 0.65 mg/dL (ref 0.44–1.00)
GFR calc Af Amer: >60 mL/min (ref >60)
Glucose, Bld: 95 mg/dL (ref 65–99)
Potassium: 3.7 mmol/L (ref 3.5–5.1)
SODIUM: 138 mmol/L (ref 135–145)

## 2017-12-21 LAB — I-STAT TROPONIN, ED
Troponin i, poc: 0 ng/mL (ref 0.00–0.08)
Troponin i, poc: 0 ng/mL (ref 0.00–0.08)

## 2017-12-21 MED ORDER — ONDANSETRON 4 MG PO TBDP: 4.0000 mg | ORAL_TABLET | Freq: Three times a day (TID) | ORAL | 0 refills | Status: AC | PRN

## 2017-12-21 MED ORDER — ONDANSETRON HCL 4 MG/2ML IJ SOLN
4.0000 mg | Freq: Once | INTRAMUSCULAR | Status: DC
  Filled 2017-12-21: qty 2

## 2017-12-21 MED ORDER — SUCRALFATE 1 G PO TABS: 1.0000 g | ORAL_TABLET | Freq: Three times a day (TID) | ORAL | 0 refills | Status: AC

## 2017-12-21 MED ORDER — GI COCKTAIL ~~LOC~~
30.0000 mL | Freq: Once | ORAL | Status: AC
  Administered 2017-12-21: 30 mL via ORAL
  Filled 2017-12-21: qty 30

## 2017-12-21 MED ORDER — SODIUM CHLORIDE 0.9 % IV BOLUS
1000.0000 mL | Freq: Once | INTRAVENOUS | Status: AC
  Administered 2017-12-21: 1000 mL via INTRAVENOUS

## 2017-12-21 MED ORDER — OMEPRAZOLE 20 MG PO CPDR: 20.0000 mg | DELAYED_RELEASE_CAPSULE | Freq: Every day | ORAL | 0 refills | Status: AC

## 2017-12-21 NOTE — ED Notes (Signed)
RN attempted x2 to start IV without success.

## 2017-12-21 NOTE — ED Provider Notes (Signed)
[facility] Provider [facility]   CSN: [id number] Arrival date & time: 12/20/17  2333     History   Chief Complaint Chief Complaint  Patient presents with  . Chest Pain    HPI Tammy Gutierrez is a 27 y.o. female.  HPI 27 year old female with past medical history of ovarian cyst here with intermittent chest pain and abdominal pain.  The patient states that for the last month, she is had intermittent epigastric pain with nausea.  This usually occurs after eating.  It has been progressively worse over the last month, but is normally intermittent.  However, for the last several days, she is had persistent aching, gnawing, epigastric and diffuse upper abdominal pain.  She said associated nausea and difficulty eating.  She is had no change in her stool.  She states that the pain became so severe today that she became lightheaded, short of breath, and experienced tingling in her bilateral upper extremities.  She subsequent presents for evaluation.  No lower extremity swelling.  No history of DVT or recent immobilization or travel.  No alleviating factors other than not eating.  Past Medical History:  Diagnosis Date  . Anemia   . Hx of ovarian cystectomy 10/17/2013   Paratubal cyst  . Ovarian cyst   . Wisdom teeth removed 2014    Patient Active Problem List   Diagnosis Date Noted  . Carpal tunnel syndrome 10/16/2016  . Left groin pain 10/16/2016  . Vaginal delivery 01/19/2015  . Second-degree perineal laceration, with delivery 01/19/2015    Past Surgical History:  Procedure Laterality Date  . ROBOTIC ASSISTED LAPAROSCOPIC OVARIAN CYSTECTOMY N/A 10/17/2013   Procedure: ROBOTIC ASSISTED LAPAROSCOPIC PARATUBAL CYSTECTOMY;  Surgeon: Antionette CharLisa Jackson-Moore, MD;  Location: [facility];  Service: Gynecology;  Laterality: N/A;  . WISDOM TOOTH EXTRACTION       OB History    Gravida  1   Para  1   Term  1   Preterm  0   AB  0   Living  1     SAB  0     TAB  0   Ectopic  0   Multiple  0   Live Births  1            Home Medications    Prior to Admission medications   Medication Sig Start Date End Date Taking? Authorizing Provider  Elastic Bandages & Supports (WRIST SPLINT/COCK-UP/RIGHT M) MISC 1 Device by Does not apply route at bedtime. 10/13/16   Freddrick MarchAmin, Yashika, MD  ferrous sulfate 325 (65 FE) MG tablet Take 325 mg by mouth daily with breakfast.    [provider]  ferrous sulfate 325 (65 FE) MG tablet Take 1 tablet (325 mg total) by mouth 2 (two) times daily with a meal. For 2 weeks. Then once daily. 01/21/15   Gerrit HeckEmly, Jessica, CNM  ibuprofen (ADVIL,MOTRIN) 600 MG tablet Take 1 tablet (600 mg total) by mouth every 6 (six) hours. 01/21/15   Gerrit HeckEmly, Jessica, CNM  omeprazole (PRILOSEC) 20 MG capsule Take 1 capsule (20 mg total) by mouth daily. 12/21/17   Shaune PollackIsaacs, Roxie Kreeger, MD  ondansetron (ZOFRAN ODT) 4 MG disintegrating tablet Take 1 tablet (4 mg total) by mouth every 8 (eight) hours as needed for nausea or vomiting. 12/21/17   Shaune PollackIsaacs, Prashant Glosser, MD  Prenatal Vit-Fe Fumarate-FA (PRENATAL MULTIVITAMIN) TABS tablet Take 1 tablet by mouth daily at 12 noon.    [provider]  sucralfate (CARAFATE) 1 g tablet Take  1 tablet (1 g total) by mouth 4 (four) times daily -  with meals and at bedtime for 7 days. 12/21/17 12/28/17  Shaune Pollack, MD    Family History Family History  Problem Relation Age of Onset  . Depression Mother   . Cancer Neg Hx   . Heart disease Neg Hx   . Diabetes Neg Hx   . Hypertension Neg Hx     Social History Social History   Tobacco Use  . Smoking status: Never Smoker  . Smokeless tobacco: Never Used  Substance Use Topics  . Alcohol use: No  . Drug use: No     Allergies   Patient has no known allergies.   Review of Systems Review of Systems  Constitutional: Positive for fatigue. Negative for chills and fever.  HENT: Negative for congestion, rhinorrhea and sore throat.   Eyes: Negative  for visual disturbance.  Respiratory: Negative for cough, shortness of breath and wheezing.   Cardiovascular: Negative for chest pain and leg swelling.  Gastrointestinal: Positive for abdominal pain, nausea and vomiting. Negative for diarrhea.  Genitourinary: Negative for dysuria, flank pain, vaginal bleeding and vaginal discharge.  Musculoskeletal: Negative for neck pain.  Skin: Negative for rash.  Allergic/Immunologic: Negative for immunocompromised state.  Neurological: Positive for weakness. Negative for syncope and headaches.  Hematological: Does not bruise/bleed easily.  All other systems reviewed and are negative.    Physical Exam Updated Vital Signs BP 110/64   Pulse 65   Temp 98.8 F (37.1 C) (Oral)   Resp 16   Ht 5\' 5"  (1.651 m)   Wt 77.1 kg (170 lb)   SpO2 100%   BMI 28.29 kg/m   Physical Exam  Constitutional: She is oriented to person, place, and time. She appears well-developed and well-nourished. No distress.  HENT:  Head: Normocephalic and atraumatic.  Eyes: Conjunctivae are normal.  Neck: Neck supple.  Cardiovascular: Normal rate, regular rhythm and normal heart sounds. Exam reveals no friction rub.  No murmur heard. Pulmonary/Chest: Effort normal and breath sounds normal. No respiratory distress. She has no wheezes. She has no rales.  Abdominal: She exhibits no distension. There is tenderness in the right upper quadrant. There is guarding and positive Murphy's sign. There is no rigidity and no rebound.  Musculoskeletal: She exhibits no edema.  Neurological: She is alert and oriented to person, place, and time. She exhibits normal muscle tone.  Skin: Skin is warm. Capillary refill takes less than 2 seconds.  Psychiatric: She has a normal mood and affect.  Nursing note and vitals reviewed.    ED Treatments / Results  Labs (all labs ordered are listed, but only abnormal results are displayed) Labs Reviewed  CBC - Abnormal; Notable for the following  components:      Result Value   Hemoglobin 10.3 (*)    HCT 32.7 (*)    All other components within normal limits  HEPATIC FUNCTION PANEL - Abnormal; Notable for the following components:   Bilirubin, Direct <0.1 (*)    All other components within normal limits  BASIC METABOLIC PANEL  LIPASE, BLOOD  I-STAT TROPONIN, ED  I-STAT BETA HCG BLOOD, ED ([facility], [facility], [facility] ONLY)  I-STAT TROPONIN, ED    EKG EKG Interpretation  Date/Time:  Thursday December 20 2017 23:36:56 EDT Ventricular Rate:  72 PR Interval:  162 QRS Duration: 94 QT Interval:  372 QTC Calculation: 407 R Axis:   103 Text Interpretation:  Normal sinus rhythm Rightward axis Borderline ECG No old  tracing to compare Confirmed by Shaune Pollack [phone]) on 12/21/2017 2:15:45 AM   Radiology Dg Chest 2 View  Result Date: 12/21/2017 CLINICAL DATA:  27 y/o  F; 2 days of chest pain and abdominal pain. EXAM: CHEST - 2 VIEW COMPARISON:  None. FINDINGS: The heart size and mediastinal contours are within normal limits. Both lungs are clear. The visualized skeletal structures are unremarkable. IMPRESSION: No acute pulmonary process identified. Electronically Signed   By: Mitzi Hansen M.D.   On: 12/21/2017 00:14   US Abdomen Limited Ruq  Result Date: 12/21/2017 CLINICAL DATA:  Right upper quadrant pain for 2 days. EXAM: ULTRASOUND ABDOMEN LIMITED RIGHT UPPER QUADRANT COMPARISON:  None. FINDINGS: Gallbladder: No gallstones or wall thickening visualized. No sonographic Murphy sign noted by sonographer. Common bile duct: Diameter: 4.1 mm, normal Liver: No focal lesion identified. Within normal limits in parenchymal echogenicity. Portal vein is patent on color Doppler imaging with normal direction of blood flow towards the liver. IMPRESSION: Normal examination.  No evidence of cholelithiasis or cholecystitis. Electronically Signed   By: Burman Nieves M.D.   On: 12/21/2017 03:20    Procedures Procedures (including critical care  time)  Medications Ordered in ED Medications  ondansetron (ZOFRAN) injection 4 mg (4 mg Intravenous Not Given 12/21/17 0420)  sodium chloride 0.9 % bolus 1,000 mL (0 mLs Intravenous Stopped 12/21/17 0522)  gi cocktail (Maalox,Lidocaine,Donnatal) (30 mLs Oral Given 12/21/17 0248)     Initial Impression / Assessment and Plan / ED Course  I have reviewed the triage vital signs and the nursing notes.  Pertinent labs & imaging results that were available during my care of the patient were reviewed by me and considered in my medical decision making (see chart for details).     27 yo F here with epigastric abdominal pain.  Pain is worse after eating.  She has occasional nausea with it.  Suspect patient has gastritis versus peptic ulcer disease.  Right upper quadrant ultrasound obtained and is negative.  She is not anemic and I see no signs of active bleeding or hemodynamic instability.  Her LFTs and lipase are normal.  She feels markedly improved after GI cocktail and is able to eat and drink without difficulty.  Will place on PPI, antiemetics, and discharged home.  Final Clinical Impressions(s) / ED Diagnoses   Final diagnoses:  RUQ pain  Acute superficial gastritis without hemorrhage    ED Discharge Orders        Ordered    omeprazole (PRILOSEC) 20 MG capsule  Daily     12/21/17 0547    sucralfate (CARAFATE) 1 g tablet  3 times daily with meals & bedtime     12/21/17 0547    ondansetron (ZOFRAN ODT) 4 MG disintegrating tablet  Every 8 hours PRN     12/21/17 0547       Shaune Pollack, MD 12/21/17 [phone]

## 2017-12-21 NOTE — ED Notes (Signed)
Patient tolerated PO fluids well

## 2017-12-21 NOTE — ED Notes (Signed)
Pt ready for discharge VS documented IV removed 

## 2018-05-03 ENCOUNTER — Ambulatory Visit (HOSPITAL_COMMUNITY)
Admission: EM | Admit: 2018-05-03 | Discharge: 2018-05-03 | Disposition: A | Discharge status: Home / Self Care | Payer: Worker's Compensation | Attending: Family Medicine | Admitting: Family Medicine

## 2018-05-03 ENCOUNTER — Encounter (HOSPITAL_COMMUNITY): Payer: Self-pay | Admitting: Emergency Medicine

## 2018-05-03 DIAGNOSIS — M79641 Pain in right hand: Secondary | ICD-10-CM

## 2018-05-03 NOTE — Discharge Instructions (Signed)
Please try to keep moving your hand.  Please follow up with [place] if your pain becomes worse or you develop different symptoms  I have provided a work note. You will need to follow up if you need another work note.

## 2018-05-03 NOTE — ED Provider Notes (Signed)
[facility]    CSN: [id number] Arrival date & time: 05/03/18  1648     History   Chief Complaint Chief Complaint  Patient presents with  . Arm Pain    HPI Tammy Gutierrez is a 27 y.o. female.   She is presenting with right hand pain.  She reports being electrocuted at work yesterday.  She is having pain on the palmar and dorsal aspect of her hand.  She denies any weakness.  The pain is worse when she uses her hand excessively.  She denies any numbness or tingling.  She denies any history of similar symptoms.  She has not taken anything for the pain.  The pain is sharp in nature.  Pain is intermittent.  Pain can be severe.  Has some radiation proximally up to the anterior aspect of her forearm.  HPI  Past Medical History:  Diagnosis Date  . Anemia   . Hx of ovarian cystectomy 10/17/2013   Paratubal cyst  . Ovarian cyst   . Wisdom teeth removed 2014    Patient Active Problem List   Diagnosis Date Noted  . Carpal tunnel syndrome 10/16/2016  . Left groin pain 10/16/2016  . Vaginal delivery 01/19/2015  . Second-degree perineal laceration, with delivery 01/19/2015    Past Surgical History:  Procedure Laterality Date  . ROBOTIC ASSISTED LAPAROSCOPIC OVARIAN CYSTECTOMY N/A 10/17/2013   Procedure: ROBOTIC ASSISTED LAPAROSCOPIC PARATUBAL CYSTECTOMY;  Surgeon: Antionette Char, MD;  Location: [facility];  Service: Gynecology;  Laterality: N/A;  . WISDOM TOOTH EXTRACTION      OB History    Gravida  1   Para  1   Term  1   Preterm  0   AB  0   Living  1     SAB  0   TAB  0   Ectopic  0   Multiple  0   Live Births  1            Home Medications    Prior to Admission medications   Medication Sig Start Date End Date Taking? Authorizing Provider  Elastic Bandages & Supports (WRIST SPLINT/COCK-UP/RIGHT M) MISC 1 Device by Does not apply route at bedtime. 10/13/16   Freddrick March, MD  ferrous sulfate 325 (65 FE) MG tablet Take 325 mg by mouth  daily with breakfast.    [provider]  ferrous sulfate 325 (65 FE) MG tablet Take 1 tablet (325 mg total) by mouth 2 (two) times daily with a meal. For 2 weeks. Then once daily. 01/21/15   Gerrit Heck, CNM  ibuprofen (ADVIL,MOTRIN) 600 MG tablet Take 1 tablet (600 mg total) by mouth every 6 (six) hours. 01/21/15   Gerrit Heck, CNM  omeprazole (PRILOSEC) 20 MG capsule Take 1 capsule (20 mg total) by mouth daily. 12/21/17   Shaune Pollack, MD  ondansetron (ZOFRAN ODT) 4 MG disintegrating tablet Take 1 tablet (4 mg total) by mouth every 8 (eight) hours as needed for nausea or vomiting. 12/21/17   Shaune Pollack, MD  Prenatal Vit-Fe Fumarate-FA (PRENATAL MULTIVITAMIN) TABS tablet Take 1 tablet by mouth daily at 12 noon.    [provider]  sucralfate (CARAFATE) 1 g tablet Take 1 tablet (1 g total) by mouth 4 (four) times daily -  with meals and at bedtime for 7 days. Patient not taking: Reported on 05/03/2018 12/21/17 12/28/17  Shaune Pollack, MD    Family History Family History  Problem Relation Age of Onset  . Depression Mother   .  Cancer Neg Hx   . Heart disease Neg Hx   . Diabetes Neg Hx   . Hypertension Neg Hx     Social History Social History   Tobacco Use  . Smoking status: Never Smoker  . Smokeless tobacco: Never Used  Substance Use Topics  . Alcohol use: No  . Drug use: No     Allergies   Patient has no known allergies.   Review of Systems Review of Systems  Constitutional: Negative for fever.  HENT: Negative for congestion.   Respiratory: Negative for cough.   Cardiovascular: Negative for chest pain.  Gastrointestinal: Negative for abdominal pain.  Musculoskeletal: Negative for back pain.  Skin: Negative for color change.  Neurological: Negative for weakness.  Hematological: Negative for adenopathy.  Psychiatric/Behavioral: Negative for agitation.     Physical Exam Triage Vital Signs ED Triage Vitals  Enc Vitals Group     BP 05/03/18  1756 106/62     Pulse Rate 05/03/18 1756 60     Resp 05/03/18 1756 16     Temp 05/03/18 1756 98.3 F (36.8 C)     Temp src --      SpO2 05/03/18 1756 100 %     Weight --      Height --      Head Circumference --      Peak Flow --      Pain Score 05/03/18 1758 6     Pain Loc --      Pain Edu? --      Excl. in GC? --    No data found.  Updated Vital Signs BP 106/62   Pulse 60   Temp 98.3 F (36.8 C)   Resp 16   LMP 04/02/2018   SpO2 100%   Visual Acuity Right Eye Distance:   Left Eye Distance:   Bilateral Distance:    Right Eye Near:   Left Eye Near:    Bilateral Near:     Physical Exam Gen: NAD, alert, cooperative with exam, well-appearing ENT: normal lips, normal nasal mucosa,  Eye: normal EOM, normal conjunctiva and lids CV:  no edema, +2 pedal pulses   Resp: no accessory muscle use, non-labored,  Skin: no rashes, no areas of induration  Neuro: normal tone, normal sensation to touch Psych:  normal insight, alert and oriented MSK:  Right hand: No swelling. Normal wrist range of motion. Normal finger abduction abduction strength resistance. Normal pincer grasp. Normal resistance to thumb extension. Normal strength to resistance with wrist flexion extension. No misalignment or malrotation Neurovascular intact  UC Treatments / Results  Labs (all labs ordered are listed, but only abnormal results are displayed) Labs Reviewed - No data to display  EKG None  Radiology No results found.  Procedures Procedures (including critical care time)  Medications Ordered in UC Medications - No data to display  Initial Impression / Assessment and Plan / UC Course  I have reviewed the triage vital signs and the nursing notes.  Pertinent labs & imaging results that were available during my care of the patient were reviewed by me and considered in my medical decision making (see chart for details).     Gracious is a 27 year old female is presenting with right  hand pain.  She is elected yesterday and concerned about having intermittent pain.  No abnormalities on exam today.  Has good strength and no misalignment.  Counseled on supportive care.  Provided a work note to limit activities that cause pain.  Advised to follow-up if she would need another work note.  This was only provided for 1 week.  Given indications to follow-up.  Could consider imaging or physical therapy if symptoms do not improve.  Could consider adding gabapentin.  Final Clinical Impressions(s) / UC Diagnoses   Final diagnoses:  Right hand pain     Discharge Instructions     Please try to keep moving your hand.  Please follow up with [place] if your pain becomes worse or you develop different symptoms  I have provided a work note. You will need to follow up if you need another work note.     ED Prescriptions    None     Controlled Substance Prescriptions [place] Controlled Substance Registry consulted? Not Applicable   Myra Rude, MD 05/03/18 [phone]

## 2018-05-03 NOTE — ED Triage Notes (Signed)
Pt states she was at work holding a clipboard with a latex gloved hand and the clipboard touched something that electrocuted her R hand. This happened yesterday. Pt c/o R hand pain, pt has mild bruising to R ring finger.

## 2018-08-29 IMAGING — US US ABDOMEN LIMITED
1 series · 14 of 25 positions shown · non-contrast
Comparison: None.

CLINICAL DATA: Right upper quadrant pain for 2 days.

EXAM:
ULTRASOUND ABDOMEN LIMITED RIGHT UPPER QUADRANT

[Series 1: us abdomen limited · 0.18mm/px · 14 of 32 slices shown]
[im 1/32]
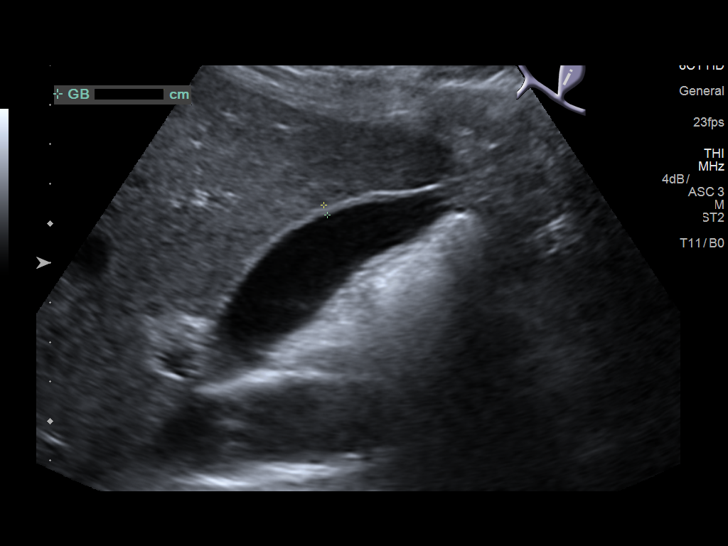
[im 3/32]
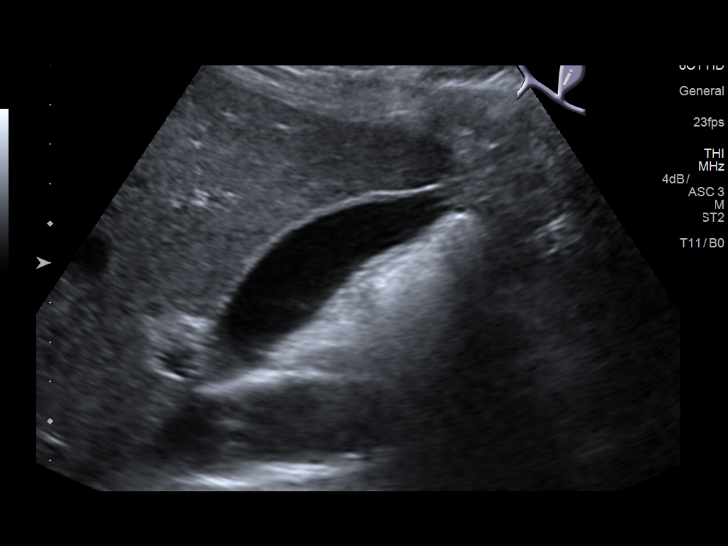
[im 6/32]
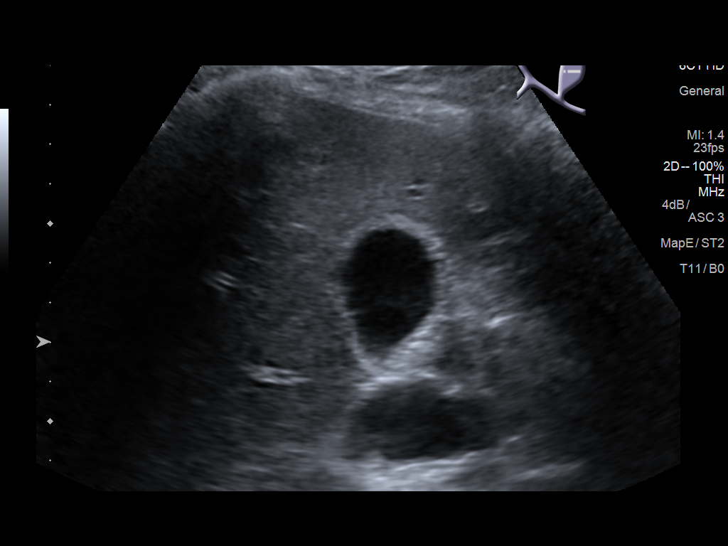
[im 8/32]
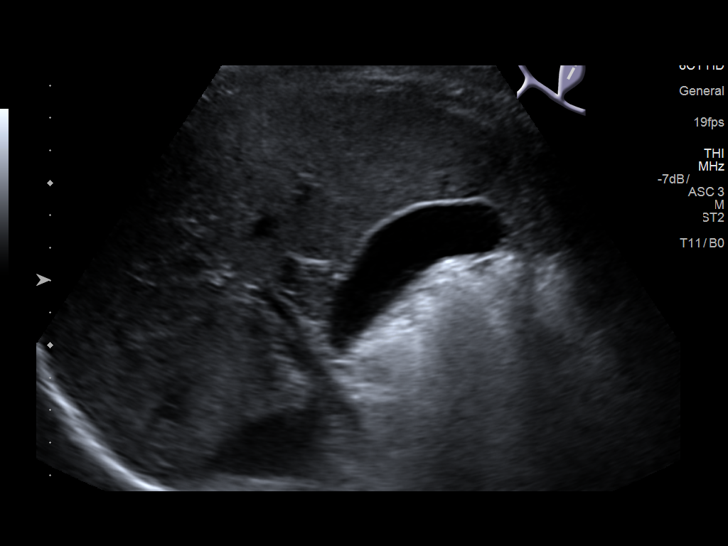
[im 11/32]
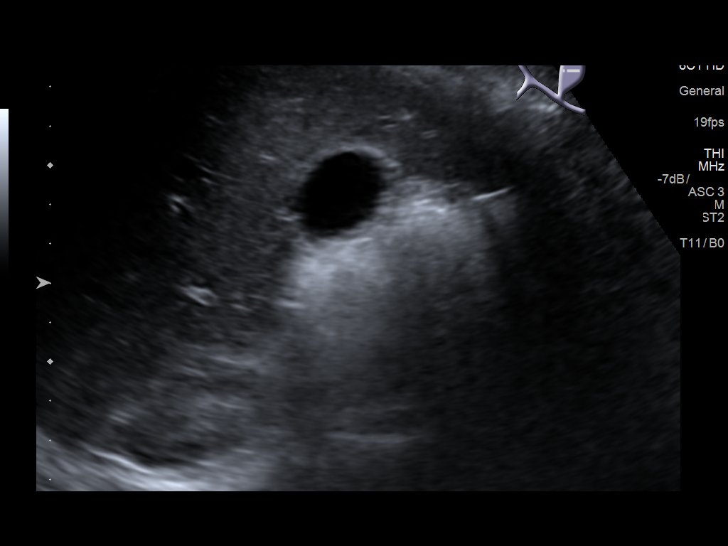
[im 12/32]
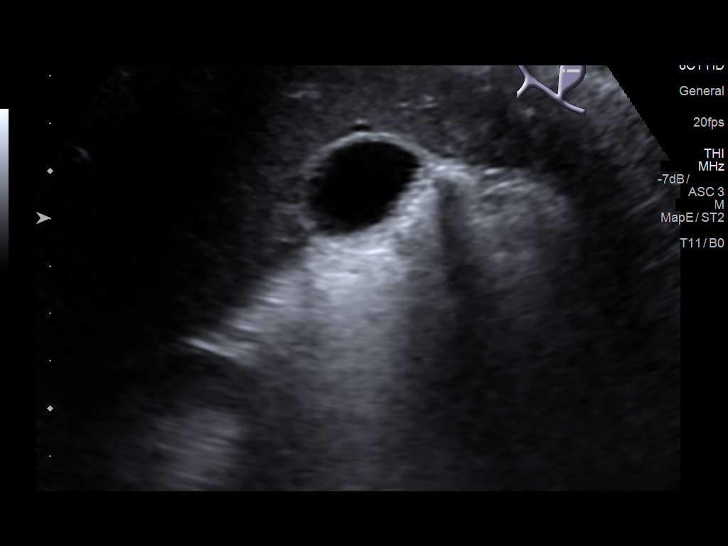
[im 15/32]
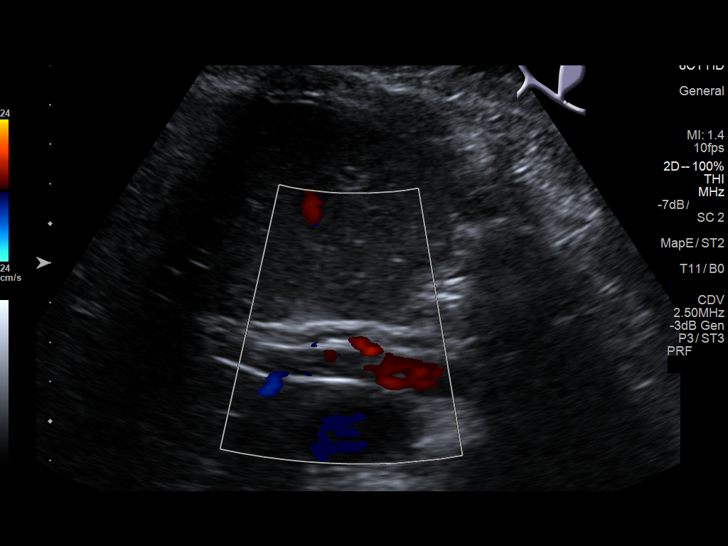
[im 17/32]
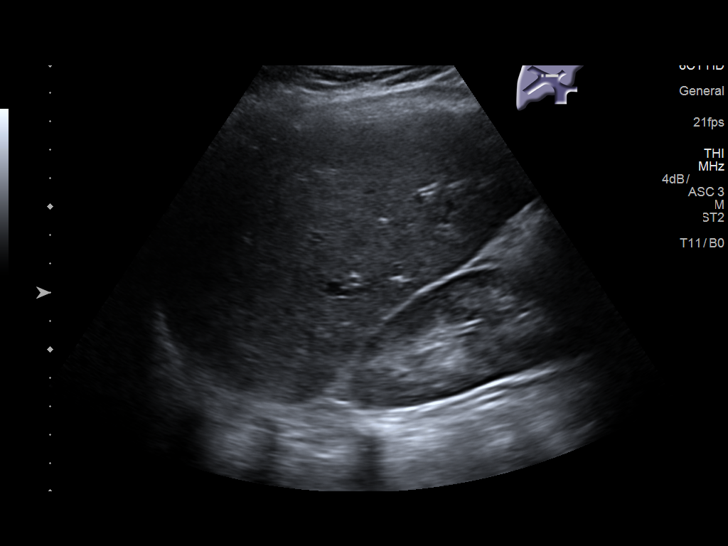
[im 20/32]
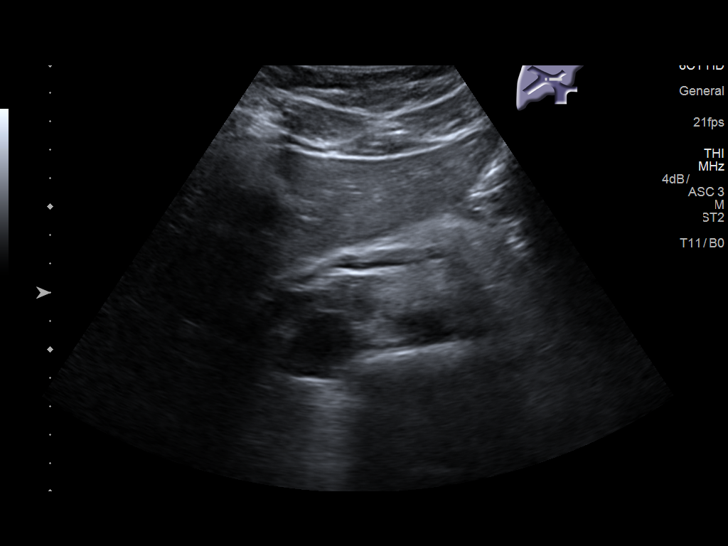
[im 21/32]
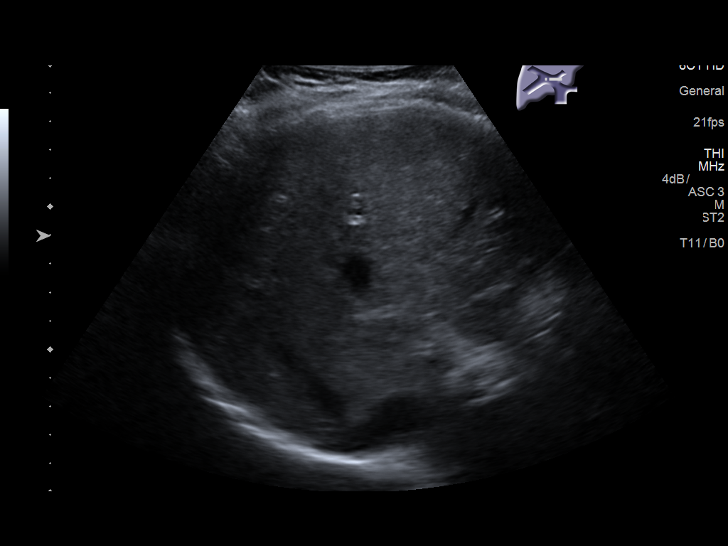
[im 24/32]
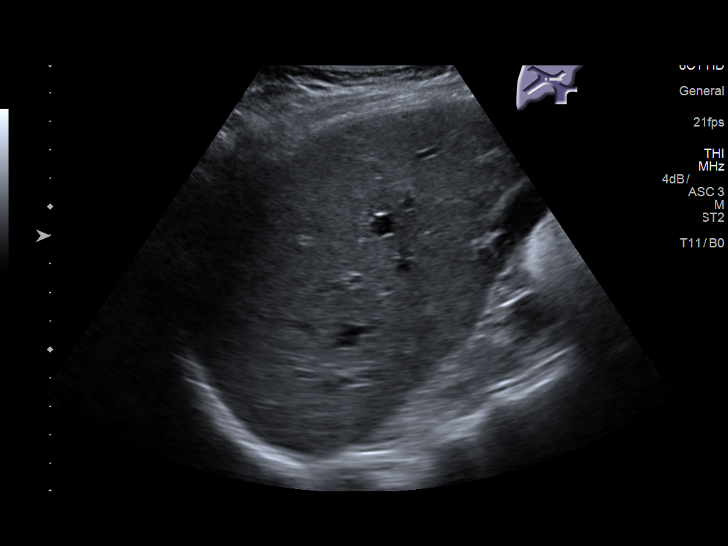
[im 26/32]
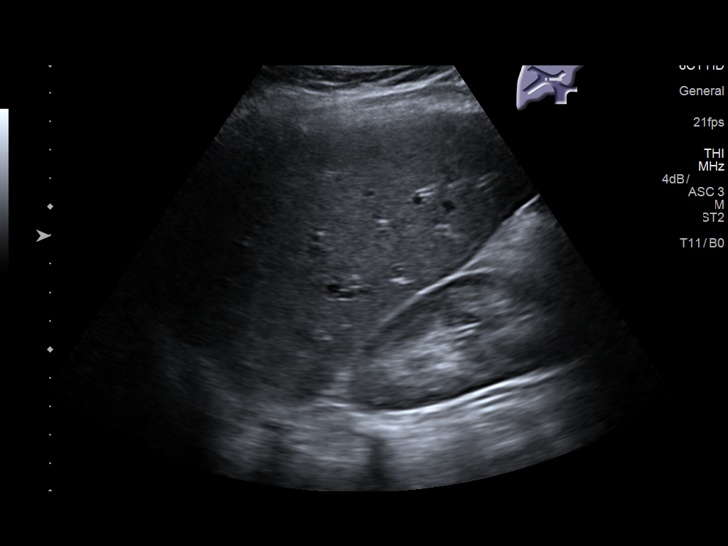
[im 29/32]
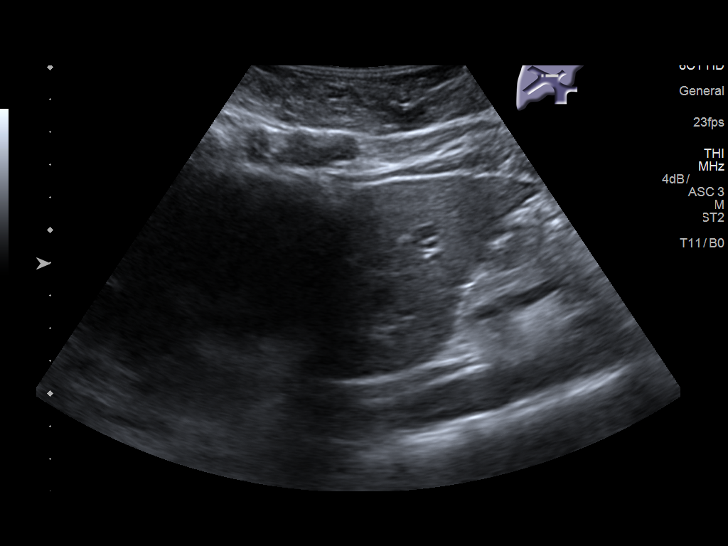
[im 32/32]
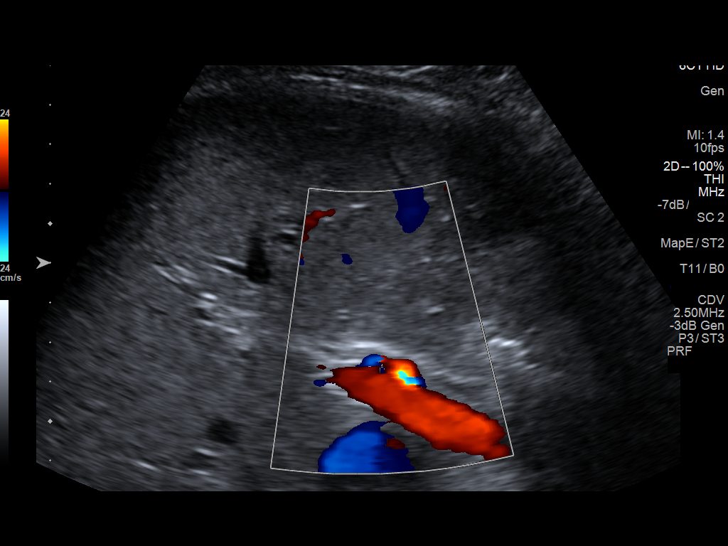

[14 of 25 positions shown; findings below may reference images not displayed]

FINDINGS: Gallbladder:

No gallstones or wall thickening visualized. No sonographic Murphy
sign noted by sonographer.

Common bile duct:

Diameter: 4.1 mm, normal

Liver:

No focal lesion identified. Within normal limits in parenchymal
echogenicity. Portal vein is patent on color Doppler imaging with
normal direction of blood flow towards the liver.
IMPRESSION: Normal examination.  No evidence of cholelithiasis or cholecystitis.

## 2018-09-21 ENCOUNTER — Ambulatory Visit (HOSPITAL_COMMUNITY)
Admission: EM | Admit: 2018-09-21 | Discharge: 2018-09-21 | Disposition: A | Discharge status: Home / Self Care | Payer: 59

## 2018-09-21 ENCOUNTER — Encounter (HOSPITAL_COMMUNITY): Payer: Self-pay

## 2018-09-21 DIAGNOSIS — R0602 Shortness of breath: Secondary | ICD-10-CM

## 2018-09-21 NOTE — ED Triage Notes (Signed)
Pt present SOB with Dizziness and fatigue. Symptoms started about 1 week ago.  Pt states the more activities she has add to her daily schedule she has these symptoms.

## 2018-09-21 NOTE — ED Provider Notes (Signed)
09/21/2018 11:05 AM   DOB: 1991-04-29 / MRN: [id number]  SUBJECTIVE:  Tammy Gutierrez is a 28 y.o. female presenting for mild waxing and waning SOB without DOE. Associates cough. Denies fever. No leg swelling and chest pain. Associates dizziness.   She has No Known Allergies.   She  has a past medical history of Anemia, ovarian cystectomy (10/17/2013), Ovarian cyst, and Wisdom teeth removed (2014).    She  reports that she has never smoked. She has never used smokeless tobacco. She reports that she does not drink alcohol or use drugs. She  reports being sexually active. She reports using the following method of birth control/protection: None. The patient  has a past surgical history that includes Wisdom tooth extraction and Robotic assisted laparoscopic ovarian cystectomy (N/A, 10/17/2013).  Her family history includes Depression in her mother.  ROS per HPI  OBJECTIVE:  BP 107/72 (BP Location: Right Arm)   Pulse 64   Temp 98.4 F (36.9 C) (Oral)   Resp 16   LMP 09/07/2018   SpO2 100%   Wt Readings from Last 3 Encounters:  12/21/17 170 lb (77.1 kg)  10/13/16 176 lb (79.8 kg)  10/06/16 176 lb 9.6 oz (80.1 kg)   Temp Readings from Last 3 Encounters:  09/21/18 98.4 F (36.9 C) (Oral)  05/03/18 98.3 F (36.8 C)  12/21/17 98.1 F (36.7 C)   BP Readings from Last 3 Encounters:  09/21/18 107/72  05/03/18 106/62  12/21/17 108/62   Pulse Readings from Last 3 Encounters:  09/21/18 64  05/03/18 60  12/21/17 70    Physical Exam Constitutional:      Appearance: She is not toxic-appearing or diaphoretic.  Cardiovascular:     Rate and Rhythm: Normal rate and regular rhythm.     Pulses: No decreased pulses.     Heart sounds: Normal heart sounds, S1 normal and S2 normal. No murmur. No friction rub. No gallop.   Pulmonary:     Effort: Pulmonary effort is normal. No respiratory distress.     Breath sounds: No stridor. No wheezing or rales.  Abdominal:     General: There is no  distension.  Skin:    General: Skin is warm and dry.     Coloration: Skin is not pale.     No results found for this or any previous visit (from the past 72 hour(s)).  No results found.  ASSESSMENT AND PLAN:   SOB (shortness of breath) - Perfect vitals signs and normal heart and lung exam. No leg swelling.  No comorbid conditions. Really wants testing for covid.  Adivse we do not have test.  She asked where there were tests available and advised I did not know as information changes so fast.  Advise self quarantine.  Discharge Instructions   None        The patient is advised to call or return to clinic if she does not see an improvement in symptoms, or to seek the care of the closest emergency department if she worsens with the above plan.   Deliah Boston, MHS, PA-C 09/21/2018 11:05 AM   Ofilia Neas, PA-C 09/21/18 1105

## 2018-09-23 ENCOUNTER — Telehealth: Payer: Self-pay | Admitting: Family Medicine

## 2018-09-23 ENCOUNTER — Telehealth (INDEPENDENT_AMBULATORY_CARE_PROVIDER_SITE_OTHER): Payer: 59 | Admitting: Family Medicine

## 2018-09-23 DIAGNOSIS — Z309 Encounter for contraceptive management, unspecified: Secondary | ICD-10-CM | POA: Insufficient documentation

## 2018-09-23 DIAGNOSIS — Z30011 Encounter for initial prescription of contraceptive pills: Secondary | ICD-10-CM

## 2018-09-23 MED ORDER — NORGESTIMATE-ETH ESTRADIOL 0.25-35 MG-MCG PO TABS: 1.0000 | ORAL_TABLET | Freq: Every day | ORAL | 4 refills | Status: DC

## 2018-09-23 NOTE — Telephone Encounter (Signed)
Error

## 2018-09-23 NOTE — Assessment & Plan Note (Signed)
Patient with desire for contraception.  Desires oral contraception pills.  Discussed the other methods of contraception including Nexplanon, IUD, Depo-Provera.  Patient chose to do birth control pills at this time.  Patient has normal blood pressure readings With last blood pressure reading from 09/21/2018 of 107/72.  [facility]  No past medical history of blood clots or migraines.  Given that patient is low risk we will plan on prescribing birth control over the phone.  Have given Rx for Sprintec.  Also advised that patient follow-up in May to ensure that birth control option is appropriate for her lifestyle and she does not desire any further contraception needs or change birth control option.  Patient should continue use condoms for STD prevention.

## 2018-09-23 NOTE — Telephone Encounter (Signed)
I was available as preceptor during this visit. Carina Brown, MD   

## 2018-09-23 NOTE — Telephone Encounter (Signed)
[facility] Telemedicine Visit  Patient consented to have visit conducted via telephone.  Encounter participants: Patient: Tammy Gutierrez  Provider: Oralia Manis  Others (if applicable): None  Chief Complaint: COVID re-assurance and birth control  HPI: COVID reassurance Patient calling for reinsurance on coronavirus.  Patient states that she has been seen in clinic last week with shortness of breath and was told to isolate herself.  Patient's brother told her she should be seen at [facility] to try and get coronavirus testing.  At [facility] patient was told that she did not have the signs and symptoms of coronavirus but did have another virus.  Was told to be resting at home.  Patient states that she has been resting and feels much better now.  Denies any cough or fever.  Still has some sore throat but better.  Shortness of breath is improved.  Wanted to make sure that she did not need to be seen again.  Birth control Patient also calling because she would like some birth control.  States that she is currently sexually active with last unprotected intercourse 2 weeks ago.  Did take a pregnancy test at [facility] which was negative on Saturday.  Patient also took Plan B after last unprotected intercourse 2 weeks ago.  Denies any personal or family history of blood clots.  Denies any history of migraines.  Has never tried any birth controls before but would like the birth control pills.    ROS: See HPI, other ROS negative   Pertinent PMHx: Vaginal delivery 4 years ago  Assessment/Plan: Provided reassurance for patient's viral illness.  It is explained to patient that viruses can take up to 7 to 14 days to improve.  What is reassuring is that patient is improving and her shortness of breath is almost resolved.  No cough or fever.  Does have some sore throat but this is also improving.  Patient states she felt better after talking to the physician.  Encounter  for birth control Patient with desire for contraception.  Desires oral contraception pills.  Discussed the other methods of contraception including Nexplanon, IUD, Depo-Provera.  Patient chose to do birth control pills at this time.  Patient has normal blood pressure readings With last blood pressure reading from 09/21/2018 of 107/72.  [facility] pregnancy [facility] on [facility]  No past medical history of blood clots or migraines.  Given that patient is low risk we will plan on prescribing birth control over the phone.  Have given Rx for Sprintec.  Also advised that patient follow-up in May to ensure that birth control option is appropriate for her lifestyle and she does not desire any further contraception needs or change birth control option.  Patient should continue use condoms for STD prevention.    Time spent on phone with patient: 25 minutes

## 2018-11-01 ENCOUNTER — Telehealth: Payer: Self-pay

## 2018-11-01 ENCOUNTER — Other Ambulatory Visit: Payer: Self-pay

## 2018-11-01 ENCOUNTER — Ambulatory Visit: Payer: Self-pay | Admitting: Family Medicine

## 2018-11-01 ENCOUNTER — Telehealth (INDEPENDENT_AMBULATORY_CARE_PROVIDER_SITE_OTHER): Payer: 59 | Admitting: Family Medicine

## 2018-11-01 DIAGNOSIS — Z3041 Encounter for surveillance of contraceptive pills: Secondary | ICD-10-CM

## 2018-11-01 MED ORDER — NORGESTIMATE-ETH ESTRADIOL 0.25-35 MG-MCG PO TABS: 1.0000 | ORAL_TABLET | Freq: Every day | ORAL | 6 refills | Status: AC

## 2018-11-01 NOTE — Progress Notes (Signed)
 [facility] Telemedicine Visit  Patient consented to have virtual visit. Method of visit: Telephone  Encounter participants: Patient: Bradley Ferris Luis-Arriaga - located at home Provider: Leland Her - located at [facility] Others (if applicable): none  Chief Complaint: birth control f/u  HPI: Patient has been able to start her Sprintec.  She remembers to take it most days.  There is 2 days in the last month where she took it an hour late.  She has had her first menstrual cycle on it.  She does have some breast tenderness but otherwise has been tolerating well.  She is overall happy with her birth control.   ROS: per HPI  Pertinent PMHx: Not applicable  Exam:  Respiratory: Normal work of breathing, speaks in complete sentences  Assessment/Plan:  Encounter for birth control Doing well on Sprintec.  Refill sent to patient pharmacy.    Time spent during visit with patient: 6 minutes  Leland Her, DO PGY-3, [facility] 11/01/2018 8:52 AM

## 2018-11-01 NOTE — Assessment & Plan Note (Signed)
Doing well on Sprintec.  Refill sent to patient pharmacy.

## 2019-04-08 IMAGING — US US PELVIS LIMITED
1 series · 15 of 25 positions shown · non-contrast
Comparison: None.

CLINICAL DATA: Left groin pain.  Possible hernia.

EXAM:
US PELVIS LIMITED
TECHNIQUE: Ultrasound examination of the pelvic soft tissues was performed in
the area of clinical concern in the left groin, and comparison
images were obtained in the region of the right groin.

[Series 1: us pelvis limited · 15 of 50 slices shown]
[im 1/50]
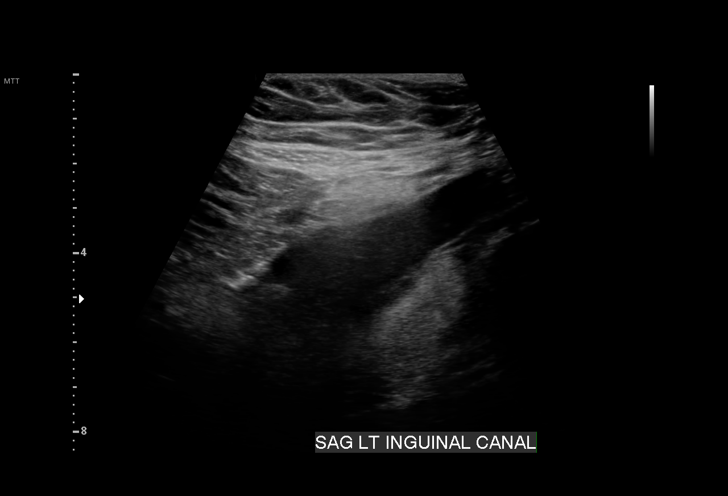
[im 5/50]
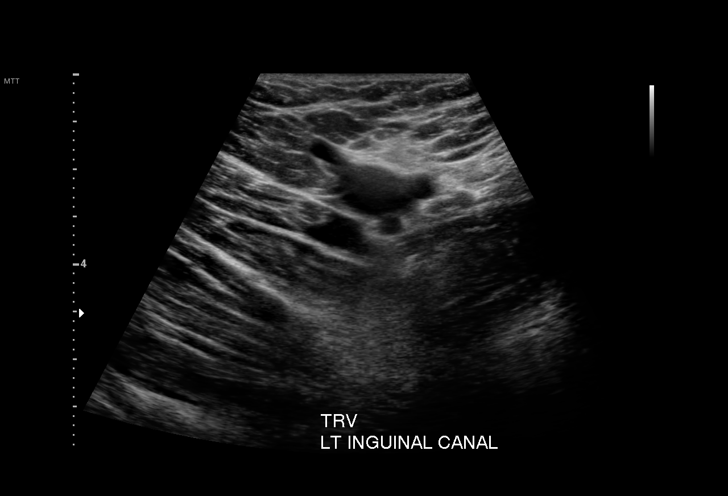
[im 9/50]
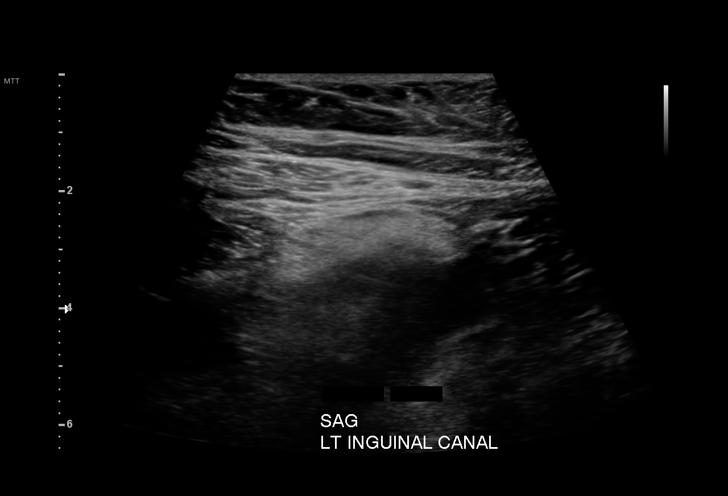
[im 11/50]
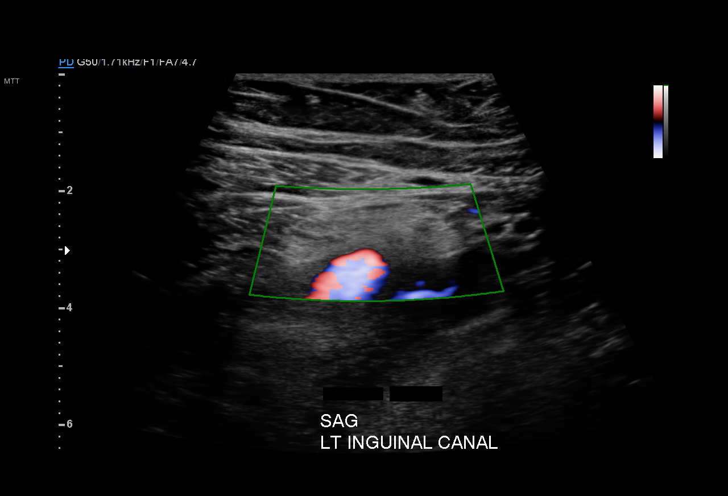
[im 15/50]
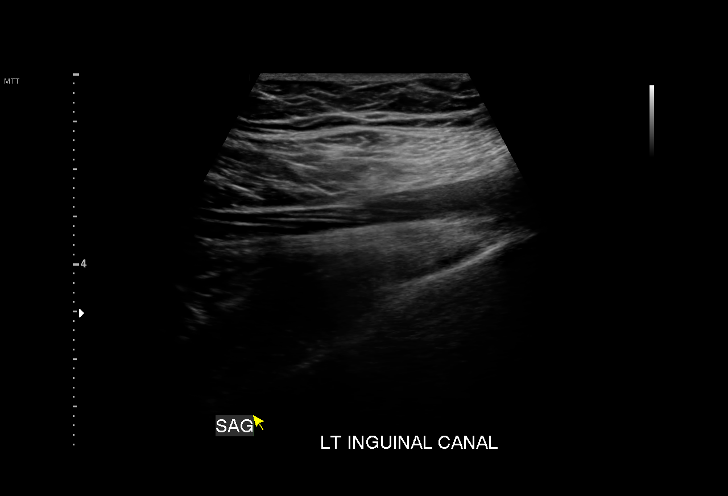
[im 19/50]
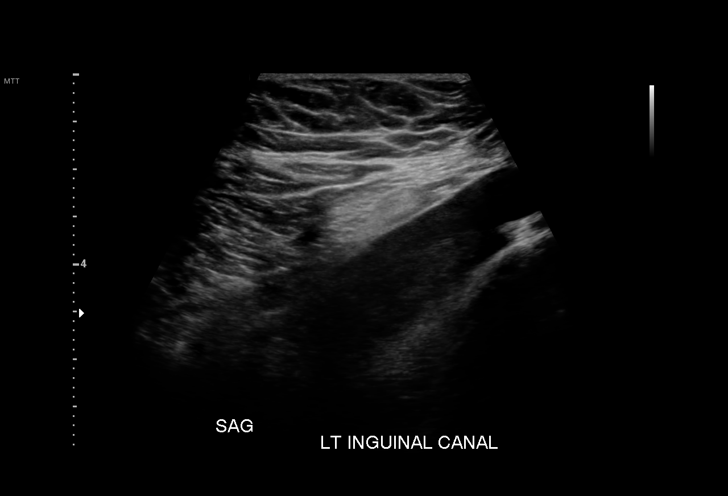
[im 21/50]
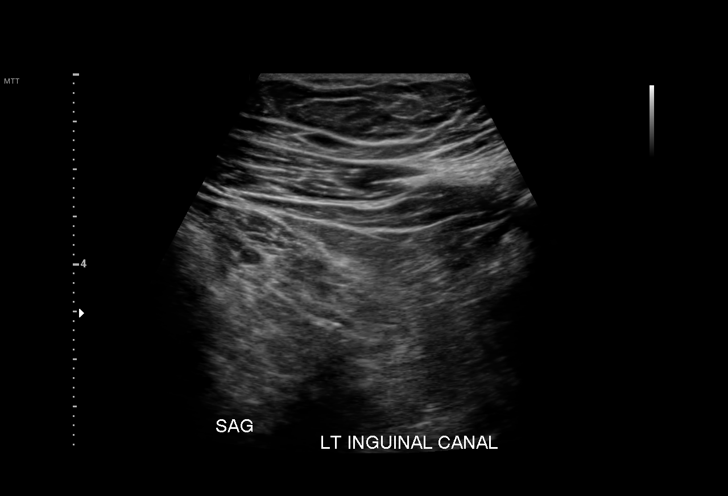
[im 25/50]
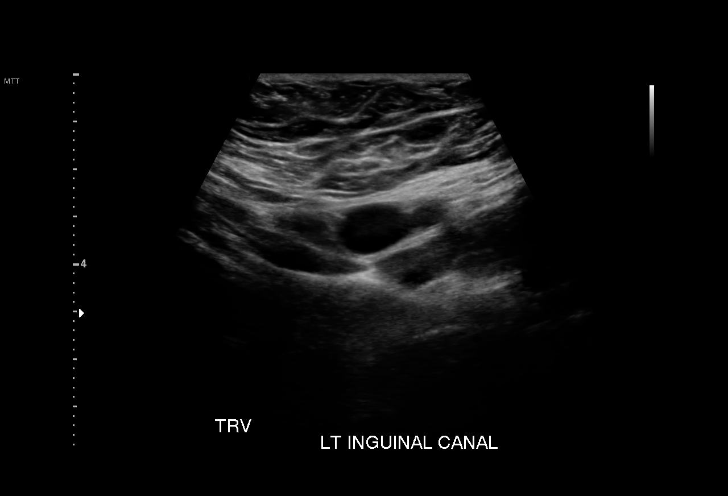
[im 29/50]
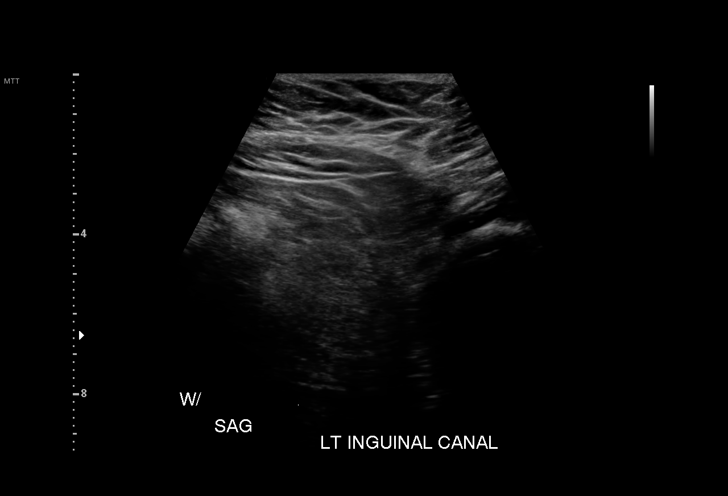
[im 31/50]
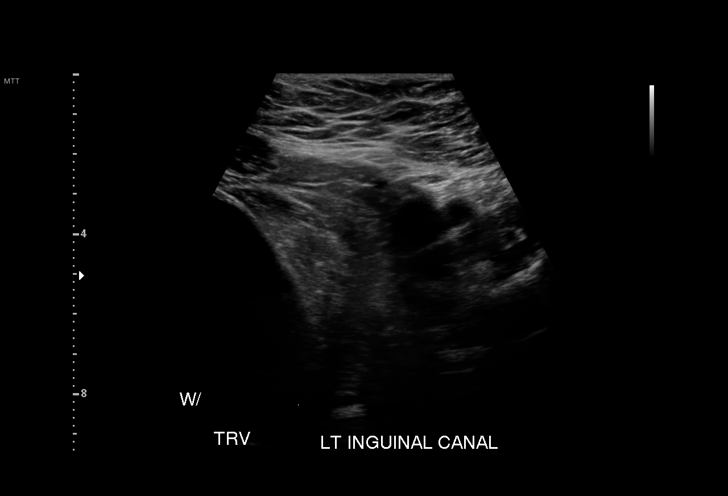
[im 35/50]
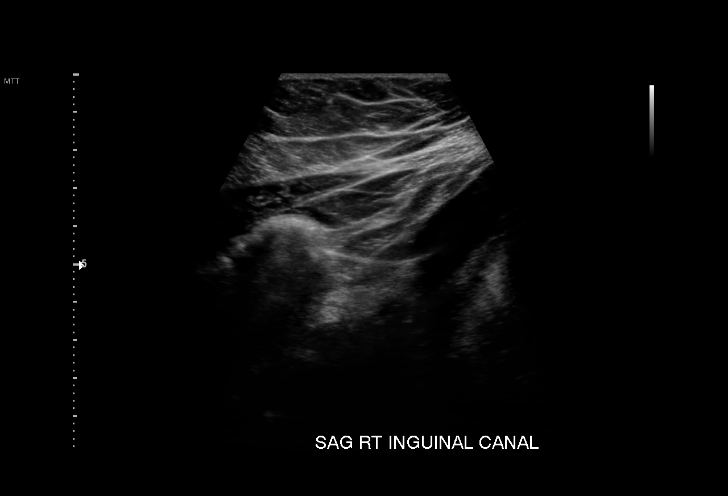
[im 39/50]
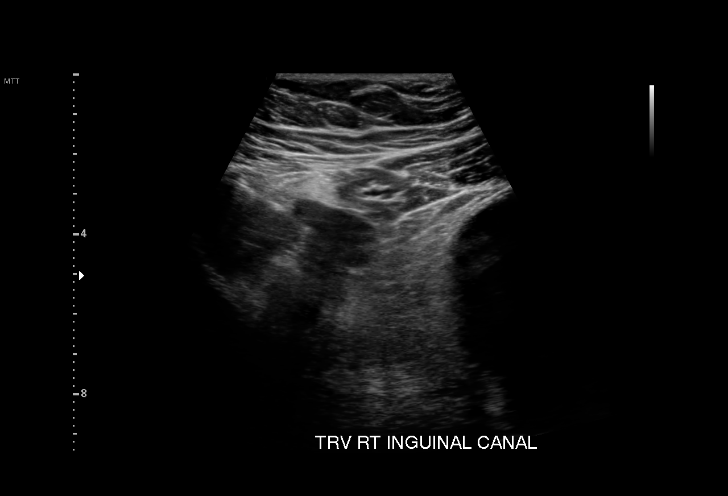
[im 41/50]
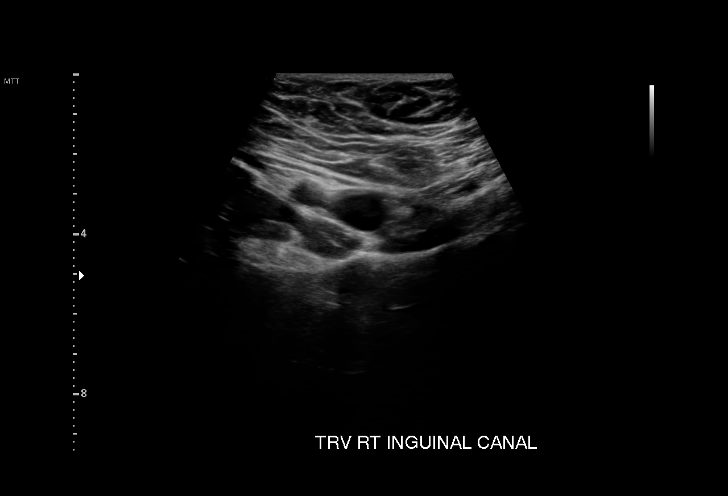
[im 45/50]
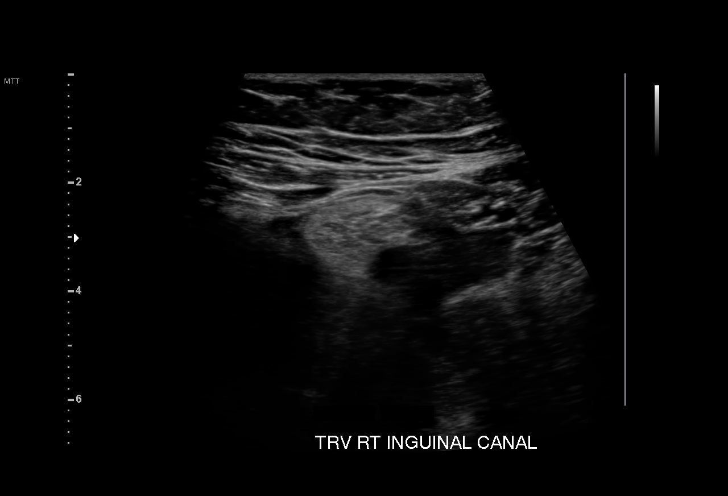
[im 50/50]
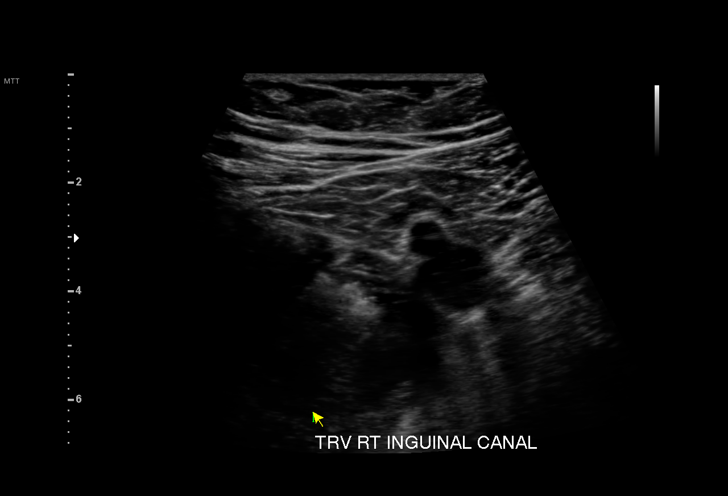

[15 of 25 positions shown; findings below may reference images not displayed]

FINDINGS: Soft tissues in in both groin and inguinal regions appear symmetric.
No hernia or mass seen within the left groin or inguinal region in
the area of clinical concern.
IMPRESSION: No sonographic evidence of left groin or inguinal hernia or mass. If
there is a persistent palpable abnormality in this region, consider
pelvis MRI for further evaluation. (MRI preferred over CT due to
lack of ionizing radiation.)

## 2020-06-21 ENCOUNTER — Inpatient Hospital Stay (HOSPITAL_COMMUNITY)
Admission: AD | Admit: 2020-06-21 | Discharge: 2020-06-21 | Disposition: A | Discharge status: Home / Self Care | Payer: 59 | Attending: Family Medicine | Admitting: Family Medicine

## 2020-06-21 ENCOUNTER — Other Ambulatory Visit: Payer: Self-pay

## 2020-06-21 DIAGNOSIS — R102 Pelvic and perineal pain: Secondary | ICD-10-CM | POA: Diagnosis present

## 2020-06-21 LAB — POCT PREGNANCY, URINE: Preg Test, Ur: NEGATIVE

## 2020-06-21 NOTE — MAU Note (Signed)
Having a lot of back pain and lower abd, been since Friday. Worse with activity.   Is expecting  Her period.  Had pain like this previously, had a cyst that required surgery.

## 2020-06-21 NOTE — Discharge Instructions (Signed)
Pelvic Pain, Female Pelvic pain is pain in your lower abdomen, below your belly button and between your hips. The pain may start suddenly (be acute), keep coming back (be recurring), or last a long time (become chronic). Pelvic pain that lasts longer than 6 months is considered chronic. Pelvic pain may affect your:  Reproductive organs.  Urinary system.  Digestive tract.  Musculoskeletal system. There are many potential causes of pelvic pain. Sometimes, the pain can be a result of digestive or urinary conditions, strained muscles or ligaments, or reproductive conditions. Sometimes the cause of pelvic pain is not known. Follow these instructions at home:   Take over-the-counter and prescription medicines only as told by your health care provider.  Rest as told by your health care provider.  Do not have sex if it hurts.  Keep a journal of your pelvic pain. Write down: ? When the pain started. ? Where the pain is located. ? What seems to make the pain better or worse, such as food or your period (menstrual cycle). ? Any symptoms you have along with the pain.  Keep all follow-up visits as told by your health care provider. This is important. Contact a health care provider if:  Medicine does not help your pain.  Your pain comes back.  You have new symptoms.  You have abnormal vaginal discharge or bleeding, including bleeding after menopause.  You have a fever or chills.  You are constipated.  You have blood in your urine or stool.  You have foul-smelling urine.  You feel weak or light-headed. Get help right away if:  You have sudden severe pain.  Your pain gets steadily worse.  You have severe pain along with fever, nausea, vomiting, or excessive sweating.  You lose consciousness. Summary  Pelvic pain is pain in your lower abdomen, below your belly button and between your hips.  There are many potential causes of pelvic pain.  Keep a journal of your pelvic  pain. This information is not intended to replace advice given to you by your health care provider. Make sure you discuss any questions you have with your health care provider. Document Revised: 12/05/2017 Document Reviewed: 12/05/2017 Elsevier Patient Education  2020 Elsevier Inc[employer]  

## 2020-06-21 NOTE — MAU Provider Note (Signed)
Event Date/Time   First Provider Initiated Contact with Patient 06/21/20 1129      S Ms. Vernell Back is a 29 y.o. G1P1001 patient who presents to MAU today with complaint of pelvic pain. No nausea, vomiting. Patient is not pregnant.   O BP 111/62 (BP Location: Right Arm)    Pulse 69    Temp 98.7 F (37.1 C) (Oral)    Resp 18    Ht 5\' 5"  (1.651 m)    Wt 75.5 kg    LMP 05/22/2020    SpO2 100%    BMI 27.69 kg/m  Physical Exam Vitals reviewed.  Abdominal:     General: Bowel sounds are normal.     Palpations: Abdomen is soft.  Skin:    General: Skin is warm and dry.     Capillary Refill: Capillary refill takes less than 2 seconds.     A Medical screening exam complete   ICD-10-CM   1. Pelvic pain  R10.2     P Discharge from MAU in stable condition Patient given the option of transfer to [facility] for further evaluation or seek care in outpatient facility of choice  She would like an appt in office. Will arrange. Warning signs for worsening condition that would warrant emergency follow-up discussed Patient may return to MAU as needed   [facility], DO 06/21/2020 11:29 AM

## 2020-07-06 ENCOUNTER — Other Ambulatory Visit: Payer: 59

## 2020-07-06 DIAGNOSIS — Z20822 Contact with and (suspected) exposure to covid-19: Secondary | ICD-10-CM

## 2020-07-12 LAB — NOVEL CORONAVIRUS, NAA: SARS-CoV-2, NAA: DETECTED — AB

## 2020-07-12 LAB — SARS-COV-2, NAA 2 DAY TAT

## 2020-07-14 ENCOUNTER — Encounter: Payer: 59 | Admitting: Family Medicine

## 2020-07-14 ENCOUNTER — Encounter: Payer: 59 | Admitting: Obstetrics & Gynecology

## 2021-02-10 ENCOUNTER — Other Ambulatory Visit (HOSPITAL_COMMUNITY)
Admission: RE | Admit: 2021-02-10 | Discharge: 2021-02-10 | Disposition: A | Discharge status: Home / Self Care | Payer: Medicaid Other | Source: Ambulatory Visit | Attending: Obstetrics and Gynecology | Admitting: Obstetrics and Gynecology

## 2021-02-10 ENCOUNTER — Other Ambulatory Visit: Payer: Self-pay

## 2021-02-10 ENCOUNTER — Ambulatory Visit (INDEPENDENT_AMBULATORY_CARE_PROVIDER_SITE_OTHER): Payer: Medicaid Other | Admitting: Obstetrics and Gynecology

## 2021-02-10 ENCOUNTER — Encounter: Payer: Self-pay | Admitting: Obstetrics and Gynecology

## 2021-02-10 VITALS — BP 107/70 | HR 72 | Wt 188.0 lb

## 2021-02-10 DIAGNOSIS — R300 Dysuria: Secondary | ICD-10-CM | POA: Diagnosis not present

## 2021-02-10 DIAGNOSIS — Z01419 Encounter for gynecological examination (general) (routine) without abnormal findings: Secondary | ICD-10-CM

## 2021-02-10 DIAGNOSIS — Z113 Encounter for screening for infections with a predominantly sexual mode of transmission: Secondary | ICD-10-CM

## 2021-02-10 DIAGNOSIS — N939 Abnormal uterine and vaginal bleeding, unspecified: Secondary | ICD-10-CM

## 2021-02-10 LAB — POCT URINALYSIS DIPSTICK
Bilirubin, UA: NEGATIVE
Glucose, UA: NEGATIVE
Ketones, UA: NEGATIVE
Leukocytes, UA: NEGATIVE
Nitrite, UA: NEGATIVE
Protein, UA: NEGATIVE
Spec Grav, UA: 1.025 (ref 1.010–1.025)
Urobilinogen, UA: 0.2 E.U./dL
pH, UA: 6 (ref 5.0–8.0)

## 2021-02-10 NOTE — Progress Notes (Signed)
GYNECOLOGY ANNUAL PREVENTATIVE CARE ENCOUNTER NOTE  Subjective:   Tammy Gutierrez is a 30 y.o. G59P1001 female here for a annual gynecologic exam. Current complaints: irregular bleeding. LMP 01/31/21, was normal for her. Had a period 11/22/20, no period in June/July. This is very uncommon for her, she never skips periods. Took several pregnancy tests and they were negative. She would be happy with a pregnancy but is not actively trying to get pregnancy. Periods are normally monthly, lasting 4-5 days, not heavy. No new medications. Has been under a significant amount of stress this year.  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  Denies abnormal vaginal bleeding, discharge, pelvic pain, problems with intercourse or other gynecologic concerns. Accepts STI screen.   Gynecologic History Patient's last menstrual period was 02/03/2021. Contraception: none Last Pap: 6 months per patient Last mammogram: n/a.  Obstetric History OB History  Gravida Para Term Preterm AB Living  1 1 1  0 0 1  SAB IAB Ectopic Multiple Live Births  0 0 0 0 1    # Outcome Date GA Lbr Len/2nd Weight Sex Delivery Anes PTL Lv  1 Term 01/19/15 [redacted]w[redacted]d 12:18 / 00:56 7 lb 1.6 oz (3.22 kg) F Vag-Spont None  LIV    Past Medical History:  Diagnosis Date   Anemia    Herpes simplex virus (HSV) infection    Hx of ovarian cystectomy 10/17/2013   Paratubal cyst   Ovarian cyst    Wisdom teeth removed 07/03/2012    Past Surgical History:  Procedure Laterality Date   ROBOTIC ASSISTED LAPAROSCOPIC OVARIAN CYSTECTOMY N/A 10/17/2013   Procedure: ROBOTIC ASSISTED LAPAROSCOPIC PARATUBAL CYSTECTOMY;  Surgeon: 10/19/2013, MD;  Location: [facility];  Service: Gynecology;  Laterality: N/A;   WISDOM TOOTH EXTRACTION      Current Outpatient Medications on File Prior to Visit  Medication Sig Dispense Refill   Elastic Bandages & Supports (WRIST SPLINT/COCK-UP/RIGHT M) MISC 1 Device by Does  not apply route at bedtime. 1 each 0   ferrous sulfate 325 (65 FE) MG tablet Take 325 mg by mouth daily with breakfast.     ferrous sulfate 325 (65 FE) MG tablet Take 1 tablet (325 mg total) by mouth 2 (two) times daily with a meal. For 2 weeks. Then once daily. 45 tablet 3   ibuprofen (ADVIL,MOTRIN) 600 MG tablet Take 1 tablet (600 mg total) by mouth every 6 (six) hours. 30 tablet 2   norgestimate-ethinyl estradiol (SPRINTEC 28) 0.25-35 MG-MCG tablet Take 1 tablet by mouth daily. 1 Package 6   omeprazole (PRILOSEC) 20 MG capsule Take 1 capsule (20 mg total) by mouth daily. 14 capsule 0   ondansetron (ZOFRAN ODT) 4 MG disintegrating tablet Take 1 tablet (4 mg total) by mouth every 8 (eight) hours as needed for nausea or vomiting. 20 tablet 0   Prenatal Vit-Fe Fumarate-FA (PRENATAL MULTIVITAMIN) TABS tablet Take 1 tablet by mouth daily at 12 noon.     sucralfate (CARAFATE) 1 g tablet Take 1 tablet (1 g total) by mouth 4 (four) times daily -  with meals and at bedtime for 7 days. (Patient not taking: Reported on 05/03/2018) 28 tablet 0   No current facility-administered medications on file prior to visit.    No Known Allergies  Social History   Socioeconomic History   Marital status: Single    Spouse name: Not on file   Number of children: Not on file   Years of education: Not on file  Highest education level: Not on file  Occupational History   Not on file  Tobacco Use   Smoking status: Never   Smokeless tobacco: Never  Substance and Sexual Activity   Alcohol use: No   Drug use: No   Sexual activity: Yes    Birth control/protection: None  Other Topics Concern   Not on file  Social History Narrative   Not on file   Social Determinants of Health   Financial Resource Strain: Not on file  Food Insecurity: Not on file  Transportation Needs: Not on file  Physical Activity: Not on file  Stress: Not on file  Social Connections: Not on file  Intimate Partner Violence: Not on file     Family History  Problem Relation Age of Onset   Depression Mother    Cancer Neg Hx    Heart disease Neg Hx    Diabetes Neg Hx    Hypertension Neg Hx    The following portions of the patient's history were reviewed and updated as appropriate: allergies, current medications, past family history, past medical history, past social history, past surgical history and problem list.  Review of Systems Pertinent items are noted in HPI.   Objective:  BP 107/70   Pulse 72   Wt 188 lb (85.3 kg)   LMP 02/03/2021   BMI 31.28 kg/m  CONSTITUTIONAL: Well-developed, well-nourished female in no acute distress.  HENT:  Normocephalic, atraumatic, External right and left ear normal. Oropharynx is clear and moist EYES: Conjunctivae and EOM are normal. Pupils are equal, round, and reactive to light. No scleral icterus.  NECK: Normal range of motion, supple, no masses.  Normal thyroid.  SKIN: Skin is warm and dry. No rash noted. Not diaphoretic. No erythema. No pallor. NEUROLOGIC: Alert and oriented to person, place, and time. Normal reflexes, muscle tone coordination. No cranial nerve deficit noted. PSYCHIATRIC: Normal mood and affect. Normal behavior. Normal judgment and thought content. CARDIOVASCULAR: Normal heart rate noted RESPIRATORY: Effort normal, no problems with respiration noted. BREASTS: Symmetric in size. No masses, skin changes, nipple drainage, or lymphadenopathy. ABDOMEN: Soft, no distention noted.  No tenderness, rebound or guarding.  PELVIC: Normal appearing external genitalia; normal appearing vaginal mucosa and cervix.  No abnormal discharge noted.  Pelvic cultures obtained. Normal uterine size, no other palpable masses, no uterine or adnexal tenderness. MUSCULOSKELETAL: Normal range of motion. No tenderness.  No cyanosis, clubbing, or edema.  2+ distal pulses.  Exam done with chaperone present.  Assessment and Plan:   1. Routine screening for STI (sexually transmitted  infection) - Hepatitis B surface antigen - Hepatitis C antibody - HIV Antibody (routine testing w rflx) - RPR - Cervicovaginal ancillary only( [place])  2. Abnormal uterine bleeding (AUB) - Skipped 2 periods, likely 2/2 stress as she had a normal period last week - if continues, return for lab work and further testing - she is agreeable to this plan - POCT urinalysis dipstick  3. Dysuria - POCT urinalysis dipstick - Urine Culture  4. Well woman exam Healthy female exam    Will follow up results of STI screen and manage accordingly. Encouraged improvement in diet and exercise.  Accepts STI screen. Routine preventative health maintenance measures emphasized. Please refer to After Visit Summary for other counseling recommendations.    Baldemar Lenis, MD, [facility] Attending Center for [employer] [facility])

## 2021-02-10 NOTE — Progress Notes (Signed)
Pt states she has not been able to conceive, would like to discuss.  Pt states she has not been on [facility] for a year.   Pt states she has used Plan B several times.  Pt has had recent irregular cycle.   Pt states she had Pap with [facility] approx 6 months ago.

## 2021-02-11 LAB — RPR: RPR Ser Ql: NONREACTIVE

## 2021-02-11 LAB — HEPATITIS B SURFACE ANTIGEN: Hepatitis B Surface Ag: NEGATIVE

## 2021-02-11 LAB — CERVICOVAGINAL ANCILLARY ONLY
Chlamydia: NEGATIVE
Comment: NEGATIVE
Comment: NEGATIVE
Comment: NORMAL
Neisseria Gonorrhea: NEGATIVE
Trichomonas: NEGATIVE

## 2021-02-11 LAB — HIV ANTIBODY (ROUTINE TESTING W REFLEX): HIV Screen 4th Generation wRfx: NONREACTIVE

## 2021-02-11 LAB — HEPATITIS C ANTIBODY: Hep C Virus Ab: <0.1 s/co ratio (ref 0.0–0.9)

## 2021-02-12 LAB — URINE CULTURE: Organism ID, Bacteria: NO GROWTH

## 2021-12-06 ENCOUNTER — Encounter: Payer: Self-pay | Admitting: *Deleted
